# Patient Record
Sex: Male | Born: 1950 | Race: White | Hispanic: No | Marital: Married | Smoking: Never smoker
Health system: Southern US, Community
[De-identification: ages and names within clinical notes are randomized; demographics above are authoritative.]

## PROBLEM LIST (undated history)

## (undated) DIAGNOSIS — E785 Hyperlipidemia, unspecified: Secondary | ICD-10-CM

## (undated) DIAGNOSIS — I700 Atherosclerosis of aorta: Secondary | ICD-10-CM

## (undated) DIAGNOSIS — Z9889 Other specified postprocedural states: Secondary | ICD-10-CM

## (undated) DIAGNOSIS — Z955 Presence of coronary angioplasty implant and graft: Secondary | ICD-10-CM

## (undated) DIAGNOSIS — I2510 Atherosclerotic heart disease of native coronary artery without angina pectoris: Secondary | ICD-10-CM

## (undated) DIAGNOSIS — N200 Calculus of kidney: Secondary | ICD-10-CM

## (undated) DIAGNOSIS — K219 Gastro-esophageal reflux disease without esophagitis: Secondary | ICD-10-CM

## (undated) DIAGNOSIS — C801 Malignant (primary) neoplasm, unspecified: Secondary | ICD-10-CM

## (undated) DIAGNOSIS — I10 Essential (primary) hypertension: Secondary | ICD-10-CM

## (undated) DIAGNOSIS — M1990 Unspecified osteoarthritis, unspecified site: Secondary | ICD-10-CM

## (undated) DIAGNOSIS — Z87442 Personal history of urinary calculi: Secondary | ICD-10-CM

## (undated) HISTORY — PX: CORONARY ANGIOPLASTY: SHX604

## (undated) HISTORY — PX: KIDNEY STONE SURGERY: SHX686

## (undated) HISTORY — PX: HERNIA REPAIR: SHX51

---

## 2002-12-14 ENCOUNTER — Other Ambulatory Visit: Payer: Self-pay

## 2002-12-15 ENCOUNTER — Other Ambulatory Visit: Payer: Self-pay

## 2002-12-16 ENCOUNTER — Other Ambulatory Visit: Payer: Self-pay

## 2003-11-25 ENCOUNTER — Emergency Department: Payer: Self-pay | Admitting: General Practice

## 2005-12-29 ENCOUNTER — Ambulatory Visit: Payer: Self-pay | Admitting: Unknown Physician Specialty

## 2007-08-07 ENCOUNTER — Ambulatory Visit: Payer: Self-pay | Admitting: Unknown Physician Specialty

## 2007-12-13 ENCOUNTER — Ambulatory Visit: Payer: Self-pay | Admitting: Internal Medicine

## 2008-03-26 ENCOUNTER — Ambulatory Visit: Payer: Self-pay | Admitting: Urology

## 2008-06-30 ENCOUNTER — Ambulatory Visit: Payer: Self-pay | Admitting: Urology

## 2011-06-14 ENCOUNTER — Ambulatory Visit: Payer: Self-pay | Admitting: Internal Medicine

## 2011-07-05 ENCOUNTER — Ambulatory Visit: Payer: Self-pay | Admitting: Unknown Physician Specialty

## 2013-09-24 DIAGNOSIS — C4490 Unspecified malignant neoplasm of skin, unspecified: Secondary | ICD-10-CM | POA: Insufficient documentation

## 2013-09-24 DIAGNOSIS — K219 Gastro-esophageal reflux disease without esophagitis: Secondary | ICD-10-CM | POA: Insufficient documentation

## 2013-09-24 DIAGNOSIS — E785 Hyperlipidemia, unspecified: Secondary | ICD-10-CM | POA: Insufficient documentation

## 2013-09-24 DIAGNOSIS — I2510 Atherosclerotic heart disease of native coronary artery without angina pectoris: Secondary | ICD-10-CM | POA: Insufficient documentation

## 2013-09-24 DIAGNOSIS — I10 Essential (primary) hypertension: Secondary | ICD-10-CM | POA: Insufficient documentation

## 2013-09-24 DIAGNOSIS — D72819 Decreased white blood cell count, unspecified: Secondary | ICD-10-CM | POA: Insufficient documentation

## 2014-07-09 DIAGNOSIS — E559 Vitamin D deficiency, unspecified: Secondary | ICD-10-CM | POA: Insufficient documentation

## 2014-08-01 DIAGNOSIS — I10 Essential (primary) hypertension: Secondary | ICD-10-CM | POA: Insufficient documentation

## 2016-01-10 ENCOUNTER — Encounter: Payer: Self-pay | Admitting: Emergency Medicine

## 2016-01-10 ENCOUNTER — Emergency Department
Admission: EM | Admit: 2016-01-10 | Discharge: 2016-01-10 | Disposition: A | Discharge status: Home / Self Care | Payer: Medicare HMO | Attending: Emergency Medicine | Admitting: Emergency Medicine

## 2016-01-10 ENCOUNTER — Emergency Department: Payer: Medicare HMO

## 2016-01-10 DIAGNOSIS — N1330 Unspecified hydronephrosis: Secondary | ICD-10-CM | POA: Insufficient documentation

## 2016-01-10 DIAGNOSIS — N23 Unspecified renal colic: Secondary | ICD-10-CM | POA: Insufficient documentation

## 2016-01-10 DIAGNOSIS — M549 Dorsalgia, unspecified: Secondary | ICD-10-CM

## 2016-01-10 DIAGNOSIS — K4091 Unilateral inguinal hernia, without obstruction or gangrene, recurrent: Secondary | ICD-10-CM | POA: Diagnosis not present

## 2016-01-10 DIAGNOSIS — I10 Essential (primary) hypertension: Secondary | ICD-10-CM | POA: Diagnosis not present

## 2016-01-10 DIAGNOSIS — M545 Low back pain: Secondary | ICD-10-CM | POA: Diagnosis present

## 2016-01-10 HISTORY — DX: Hyperlipidemia, unspecified: E78.5

## 2016-01-10 HISTORY — DX: Calculus of kidney: N20.0

## 2016-01-10 HISTORY — DX: Essential (primary) hypertension: I10

## 2016-01-10 LAB — URINALYSIS, COMPLETE (UACMP) WITH MICROSCOPIC
Bilirubin Urine: NEGATIVE
GLUCOSE, UA: NEGATIVE mg/dL
Ketones, ur: NEGATIVE mg/dL
Leukocytes, UA: NEGATIVE
Nitrite: NEGATIVE
PH: 5 (ref 5.0–8.0)
Protein, ur: NEGATIVE mg/dL
RBC / HPF: 70 RBC/hpf — ABNORMAL HIGH (ref 0–5)
SPECIFIC GRAVITY, URINE: 1.013 (ref 1.005–1.030)
SQUAMOUS EPITHELIAL / LPF: NONE SEEN

## 2016-01-10 LAB — CBC WITH DIFFERENTIAL/PLATELET
BASOS ABS: 0.1 10*3/uL (ref 0–0.1)
BASOS PCT: 1 %
Eosinophils Absolute: 0.1 10*3/uL (ref 0–0.7)
Eosinophils Relative: 1 %
HEMATOCRIT: 52.9 % — AB (ref 40.0–52.0)
HEMOGLOBIN: 18.3 g/dL — AB (ref 13.0–18.0)
Lymphocytes Relative: 9 %
Lymphs Abs: 1 10*3/uL (ref 1.0–3.6)
MCH: 31.4 pg (ref 26.0–34.0)
MCHC: 34.6 g/dL (ref 32.0–36.0)
MCV: 90.7 fL (ref 80.0–100.0)
Monocytes Absolute: 0.8 10*3/uL (ref 0.2–1.0)
Monocytes Relative: 8 %
NEUTROS ABS: 8.7 10*3/uL — AB (ref 1.4–6.5)
NEUTROS PCT: 81 %
Platelets: 211 10*3/uL (ref 150–440)
RBC: 5.83 MIL/uL (ref 4.40–5.90)
RDW: 13.9 % (ref 11.5–14.5)
WBC: 10.7 10*3/uL — ABNORMAL HIGH (ref 3.8–10.6)

## 2016-01-10 LAB — COMPREHENSIVE METABOLIC PANEL
ALT: 23 U/L (ref 17–63)
ANION GAP: 5 (ref 5–15)
AST: 25 U/L (ref 15–41)
Albumin: 4.4 g/dL (ref 3.5–5.0)
Alkaline Phosphatase: 68 U/L (ref 38–126)
BILIRUBIN TOTAL: 0.7 mg/dL (ref 0.3–1.2)
BUN: 18 mg/dL (ref 6–20)
CALCIUM: 9.4 mg/dL (ref 8.9–10.3)
CO2: 27 mmol/L (ref 22–32)
Chloride: 105 mmol/L (ref 101–111)
Creatinine, Ser: 0.9 mg/dL (ref 0.61–1.24)
GFR calc non Af Amer: >60 mL/min (ref >60)
Glucose, Bld: 106 mg/dL — ABNORMAL HIGH (ref 65–99)
Potassium: 4 mmol/L (ref 3.5–5.1)
SODIUM: 137 mmol/L (ref 135–145)
TOTAL PROTEIN: 7.1 g/dL (ref 6.5–8.1)

## 2016-01-10 LAB — LIPASE, BLOOD: Lipase: 18 U/L (ref 11–51)

## 2016-01-10 MED ORDER — TAMSULOSIN HCL 0.4 MG PO CAPS: 0.4000 mg | ORAL_CAPSULE | Freq: Every day | ORAL | 0 refills | Status: DC

## 2016-01-10 MED ORDER — ONDANSETRON HCL 4 MG PO TABS: 4.0000 mg | ORAL_TABLET | Freq: Three times a day (TID) | ORAL | 0 refills | Status: DC | PRN

## 2016-01-10 NOTE — ED Notes (Signed)

## 2016-01-10 NOTE — Discharge Instructions (Signed)
You have an 8 mm stone. It will not likely pass on its own. You  need to follow closely with urology tomorrow or the next day. If you have fever, or increased pain return to the emergency room. We did discuss already all the findings on her CT scan including the questionable thickening of the bowel wall which will require outpatient follow-up with GI and you  primary care doctor, as well as the likely scarring in your lung base which she already knew about. If you have any new or worrisome symptoms please return to the emergency department. There was a pleasure to treat you today.  Happy new years.

## 2016-01-10 NOTE — ED Provider Notes (Addendum)
[facility] Emergency Department Provider Note  ____________________________________________   I have reviewed the triage vital signs and the nursing notes.   HISTORY  Chief Complaint Back Pain    HPI George [name]. is a 65 y.o. male who presents today complaining of right-sided back pain. History of kidney stones also history of disc issues. Patient states that he has had no fever or chills. The pain has been there for a few days. No vomiting but sometimes has had nausea. Mostly he has had kidney stones in the left and this feels like that except for on the right. No fever no chills no significant abdominal pain or numbness no weakness no cauda equina syndrome symptoms.  No dysuria no urinary frequency no other complaints   Past Medical History:  Diagnosis Date  . Hyperlipemia   . Hypertension   . Kidney stones     There are no active problems to display for this patient.   Past Surgical History:  Procedure Laterality Date  . HERNIA REPAIR      Prior to Admission medications   Not on File    Allergies Contrast media [iodinated diagnostic agents] and Penicillins  History reviewed. No pertinent family history.  Social History Social History  Substance Use Topics  . Smoking status: Never Smoker  . Smokeless tobacco: Never Used  . Alcohol use Yes    Review of Systems Constitutional: No fever/chills Eyes: No visual changes. ENT: No sore throat. No stiff neck no neck pain Cardiovascular: Denies chest pain. Respiratory: Denies shortness of breath. Gastrointestinal:   no vomiting.  No diarrhea.  No constipation. Genitourinary: Negative for dysuria. Musculoskeletal: Negative lower extremity swelling Skin: Negative for rash. Neurological: Negative for severe headaches, focal weakness or numbness. 10-point ROS otherwise negative.  ____________________________________________   PHYSICAL EXAM:  VITAL SIGNS: ED Triage Vitals  Enc  Vitals Group     BP 01/10/16 1541 (!) 158/90     Pulse Rate 01/10/16 1541 78     Resp 01/10/16 1541 18     Temp 01/10/16 1541 98 F (36.7 C)     Temp Source 01/10/16 1541 Oral     SpO2 01/10/16 1541 97 %     Weight 01/10/16 1542 187 lb (84.8 kg)     Height 01/10/16 1542 5' 10.5" (1.791 m)     Head Circumference --      Peak Flow --      Pain Score 01/10/16 1542 6     Pain Loc --      Pain Edu? --      Excl. in [place]? --     Constitutional: Alert and oriented. Well appearing and in no acute distress. Eyes: Conjunctivae are normal. PERRL. EOMI. Head: Atraumatic. Nose: No congestion/rhinnorhea. Mouth/Throat: Mucous membranes are moist.  Oropharynx non-erythematous. Neck: No stridor.   Nontender with no meningismus Cardiovascular: Normal rate, regular rhythm. Grossly normal heart sounds.  Good peripheral circulation. Respiratory: Normal respiratory effort.  No retractions. Lungs CTAB. Abdominal: Soft and nontender. No distention. No guarding no rebound Back:  There is no focal tenderness or step off.  there is no midline tenderness there are no lesions noted. there is Mild right CVA tenderness Musculoskeletal: No lower extremity tenderness, no upper extremity tenderness. No joint effusions, no DVT signs strong distal pulses no edema Neurologic:  Normal speech and language. No gross focal neurologic deficits are appreciated.  Skin:  Skin is warm, dry and intact. No rash noted. Psychiatric: Mood and affect  are normal. Speech and behavior are normal.  ____________________________________________   LABS (all labs ordered are listed, but only abnormal results are displayed)  Labs Reviewed  URINALYSIS, COMPLETE (UACMP) WITH MICROSCOPIC - Abnormal; Notable for the following:       Result Value   Color, Urine YELLOW (*)    APPearance CLEAR (*)    Hgb urine dipstick MODERATE (*)    RBC / HPF 70 (*)    All other components within normal limits  COMPREHENSIVE METABOLIC PANEL - Abnormal;  Notable for the following:    Glucose, Bld 106 (*)    All other components within normal limits  CBC WITH DIFFERENTIAL/PLATELET - Abnormal; Notable for the following:    WBC 10.7 (*)    Hemoglobin 18.3 (*)    HCT 52.9 (*)    Neutro Abs 8.7 (*)    All other components within normal limits  LIPASE, BLOOD   ____________________________________________  EKG  I personally interpreted any EKGs ordered by me or triage  ____________________________________________  RADIOLOGY  I reviewed any imaging ordered by me or triage that were performed during my shift and, if possible, patient and/or family made aware of any abnormal findings. ____________________________________________   PROCEDURES  Procedure(s) performed: None  Procedures  Critical Care performed: None  ____________________________________________   INITIAL IMPRESSION / ASSESSMENT AND PLAN / ED COURSE  Pertinent labs & imaging results that were available during my care of the patient were reviewed by me and considered in my medical decision making (see chart for details).  Patient has a urologist. He has excellent outpatient follow-up. He took a Ambulance person prior to coming in and he is not having any pain at this time. He declines any pain medication from me. Kidney function is preserved. I made him aware of all the findings on CT including the somewhat equivocal bowel thickening. Patient will follow closely with his doctor and have another colonoscopy. His last coma scale was 4 years ago and normal according to patient. He is aware of the possibility this could represent an oncologic process. In addition, patient made aware of possible copies of kidney stone including most importantly infection. He understands he must come back for increased pain, or fever. She states he has a known scarring as lung base from prior pneumonia, he has no symptoms of pneumonia, no other indication for further workup of that CT finding I think is  indicated. He feels very comfortable with all of these findings and will closely follow up as an outpatient. He has 16 Percocet left from prior illness and does not require a Percocet for Prescription however he would like Zofran and Flomax which we will provide.  Clinical Course    ____________________________________________   FINAL CLINICAL IMPRESSION(S) / ED DIAGNOSES  Final diagnoses:  Back pain      This chart was dictated using voice recognition software.  Despite best efforts to proofread,  errors can occur which can change meaning.      Schuyler Amor, MD 01/10/16 [place], MD 01/10/16 [phone]

## 2016-01-10 NOTE — ED Triage Notes (Signed)
Pt to ED with c/o of right side lower back pain that has been ongoing for "months". Pt states that he had a BM today and since the pain has increased. Pt has hx of kidney stones. Pt still has appendix and gall bladder.

## 2016-01-19 DIAGNOSIS — R935 Abnormal findings on diagnostic imaging of other abdominal regions, including retroperitoneum: Secondary | ICD-10-CM | POA: Insufficient documentation

## 2016-01-19 DIAGNOSIS — I700 Atherosclerosis of aorta: Secondary | ICD-10-CM | POA: Insufficient documentation

## 2016-01-19 DIAGNOSIS — E538 Deficiency of other specified B group vitamins: Secondary | ICD-10-CM | POA: Insufficient documentation

## 2016-01-29 ENCOUNTER — Encounter: Payer: Self-pay | Admitting: *Deleted

## 2016-01-29 ENCOUNTER — Ambulatory Visit
Admission: RE | Admit: 2016-01-29 | Discharge: 2016-01-29 | Disposition: A | Discharge status: Home / Self Care | Payer: Medicare HMO | Source: Ambulatory Visit | Attending: Unknown Physician Specialty | Admitting: Unknown Physician Specialty

## 2016-01-29 ENCOUNTER — Ambulatory Visit: Payer: Medicare HMO | Admitting: Anesthesiology

## 2016-01-29 ENCOUNTER — Encounter
Admission: RE | Disposition: A | Discharge status: Home / Self Care | Payer: Self-pay | Source: Ambulatory Visit | Attending: Unknown Physician Specialty

## 2016-01-29 DIAGNOSIS — I10 Essential (primary) hypertension: Secondary | ICD-10-CM | POA: Insufficient documentation

## 2016-01-29 DIAGNOSIS — K5730 Diverticulosis of large intestine without perforation or abscess without bleeding: Secondary | ICD-10-CM | POA: Insufficient documentation

## 2016-01-29 DIAGNOSIS — Z7982 Long term (current) use of aspirin: Secondary | ICD-10-CM | POA: Insufficient documentation

## 2016-01-29 DIAGNOSIS — Z86010 Personal history of colonic polyps: Secondary | ICD-10-CM | POA: Insufficient documentation

## 2016-01-29 DIAGNOSIS — Z955 Presence of coronary angioplasty implant and graft: Secondary | ICD-10-CM | POA: Insufficient documentation

## 2016-01-29 DIAGNOSIS — Z880 Allergy status to penicillin: Secondary | ICD-10-CM | POA: Insufficient documentation

## 2016-01-29 DIAGNOSIS — K640 First degree hemorrhoids: Secondary | ICD-10-CM | POA: Diagnosis not present

## 2016-01-29 DIAGNOSIS — E785 Hyperlipidemia, unspecified: Secondary | ICD-10-CM | POA: Insufficient documentation

## 2016-01-29 DIAGNOSIS — I2510 Atherosclerotic heart disease of native coronary artery without angina pectoris: Secondary | ICD-10-CM | POA: Diagnosis not present

## 2016-01-29 DIAGNOSIS — Z91041 Radiographic dye allergy status: Secondary | ICD-10-CM | POA: Insufficient documentation

## 2016-01-29 DIAGNOSIS — K621 Rectal polyp: Secondary | ICD-10-CM | POA: Diagnosis not present

## 2016-01-29 DIAGNOSIS — R933 Abnormal findings on diagnostic imaging of other parts of digestive tract: Secondary | ICD-10-CM | POA: Diagnosis present

## 2016-01-29 HISTORY — PX: COLONOSCOPY WITH PROPOFOL: SHX5780

## 2016-01-29 SURGERY — COLONOSCOPY WITH PROPOFOL
Anesthesia: General

## 2016-01-29 MED ORDER — MIDAZOLAM HCL 2 MG/2ML IJ SOLN
INTRAMUSCULAR | Status: AC
  Filled 2016-01-29: qty 2

## 2016-01-29 MED ORDER — SODIUM CHLORIDE 0.9 % IV SOLN
INTRAVENOUS | Status: DC
  Administered 2016-01-29: 08:00:00 via INTRAVENOUS

## 2016-01-29 MED ORDER — PROPOFOL 10 MG/ML IV BOLUS
INTRAVENOUS | Status: DC | PRN
Start: 2016-01-29 — End: 2016-01-29
  Administered 2016-01-29: 100 mg via INTRAVENOUS

## 2016-01-29 MED ORDER — PROPOFOL 500 MG/50ML IV EMUL
INTRAVENOUS | Status: DC | PRN
  Administered 2016-01-29: 120 ug/kg/min via INTRAVENOUS

## 2016-01-29 MED ORDER — MIDAZOLAM HCL 2 MG/2ML IJ SOLN
INTRAMUSCULAR | Status: DC | PRN
  Administered 2016-01-29: 1 mg via INTRAVENOUS

## 2016-01-29 MED ORDER — EPHEDRINE SULFATE-NACL 50-0.9 MG/10ML-% IV SOSY
PREFILLED_SYRINGE | INTRAVENOUS | Status: DC | PRN
Start: 2016-01-29 — End: 2016-01-29
  Administered 2016-01-29: 10 mg via INTRAVENOUS

## 2016-01-29 MED ORDER — PROPOFOL 10 MG/ML IV BOLUS
INTRAVENOUS | Status: AC
  Filled 2016-01-29: qty 20

## 2016-01-29 NOTE — Anesthesia Post-op Follow-up Note (Cosign Needed)
Anesthesia QCDR form completed.        

## 2016-01-29 NOTE — Anesthesia Preprocedure Evaluation (Signed)
Anesthesia Evaluation  Patient identified by MRN, date of birth, ID band Patient awake    Reviewed: Allergy & Precautions, H&P , NPO status , Patient's Chart, lab work & pertinent test results  History of Anesthesia Complications Negative for: history of anesthetic complications  Airway Mallampati: III  TM Distance: >3 FB Neck ROM: limited    Dental  (+) Poor Dentition, Chipped   Pulmonary neg pulmonary ROS, neg shortness of breath,    Pulmonary exam normal breath sounds clear to auscultation       Cardiovascular Exercise Tolerance: Good hypertension, (-) angina+ CAD and + Cardiac Stents  (-) Past MI and (-) DOE Normal cardiovascular exam Rhythm:regular Rate:Normal     Neuro/Psych negative neurological ROS  negative psych ROS   GI/Hepatic negative GI ROS, Neg liver ROS,   Endo/Other  negative endocrine ROS  Renal/GU Renal disease  negative genitourinary   Musculoskeletal   Abdominal   Peds  Hematology negative hematology ROS (+)   Anesthesia Other Findings Past Medical History: No date: Hyperlipemia No date: Hypertension No date: Kidney stones  Past Surgical History: No date: CORONARY ANGIOPLASTY No date: HERNIA REPAIR No date: KIDNEY STONE SURGERY  BMI    Body Mass Index:  26.88 kg/m      Reproductive/Obstetrics negative OB ROS                             Anesthesia Physical Anesthesia Plan  ASA: III  Anesthesia Plan: General   Post-op Pain Management:    Induction:   Airway Management Planned:   Additional Equipment:   Intra-op Plan:   Post-operative Plan:   Informed Consent: I have reviewed the patients History and Physical, chart, labs and discussed the procedure including the risks, benefits and alternatives for the proposed anesthesia with the patient or authorized representative who has indicated his/her understanding and acceptance.   Dental Advisory  Given  Plan Discussed with: Anesthesiologist, CRNA and Surgeon  Anesthesia Plan Comments:         Anesthesia Quick Evaluation

## 2016-01-29 NOTE — H&P (Signed)
   Primary Care Physician:  Adin Hector, MD Primary Gastroenterologist:  Dr. Vira Agar  Pre-Procedure History & Physical: HPI:  George [name]. is a 66 y.o. male is here for an colonoscopy.   Past Medical History:  Diagnosis Date  . Hyperlipemia   . Hypertension   . Kidney stones     Past Surgical History:  Procedure Laterality Date  . CORONARY ANGIOPLASTY    . HERNIA REPAIR    . KIDNEY STONE SURGERY      Prior to Admission medications   Medication Sig Start Date End Date Taking? Authorizing Provider  aspirin EC 81 MG tablet Take 81 mg by mouth daily.   Yes Historical Provider, MD  cetirizine (ZYRTEC) 10 MG tablet Take 10 mg by mouth daily as needed for allergies.   Yes Historical Provider, MD  Cyanocobalamin (VITAMIN B 12 PO) Take 1 tablet by mouth daily.   Yes Historical Provider, MD  quinapril (ACCUPRIL) 20 MG tablet Take 20 mg by mouth daily.   Yes Historical Provider, MD  rosuvastatin (CRESTOR) 20 MG tablet Take 20 mg by mouth daily.   Yes Historical Provider, MD  ondansetron (ZOFRAN) 4 MG tablet Take 1 tablet (4 mg total) by mouth every 8 (eight) hours as needed for nausea or vomiting. 01/10/16   Schuyler Amor, MD  tamsulosin (FLOMAX) 0.4 MG CAPS capsule Take 1 capsule (0.4 mg total) by mouth daily. Patient not taking: Reported on 01/29/2016 01/10/16   Schuyler Amor, MD    Allergies as of 01/28/2016 - Review Complete 01/10/2016  Allergen Reaction Noted  . Contrast media [iodinated diagnostic agents]  01/10/2016  . Penicillins  01/10/2016    History reviewed. No pertinent family history.  Social History   Social History  . Marital status: Married    Spouse name: N/A  . Number of children: N/A  . Years of education: N/A   Occupational History  . Not on file.   Social History Main Topics  . Smoking status: Never Smoker  . Smokeless tobacco: Never Used  . Alcohol use Yes  . Drug use: No  . Sexual activity: Not on file   Other Topics Concern  .  Not on file   Social History Narrative  . No narrative on file    Review of Systems: See HPI, otherwise negative ROS  Physical Exam: BP 119/83   Pulse 64   Temp 97.8 F (36.6 C) (Tympanic)   Resp 16   Ht 5' 10.5" (1.791 m)   Wt 86.2 kg (190 lb)   SpO2 99%   BMI 26.88 kg/m  General:   Alert,  pleasant and cooperative in NAD Head:  Normocephalic and atraumatic. Neck:  Supple; no masses or thyromegaly. Lungs:  Clear throughout to auscultation.    Heart:  Regular rate and rhythm. Abdomen:  Soft, nontender and nondistended. Normal bowel sounds, without guarding, and without rebound.   Neurologic:  Alert and  oriented x4;  grossly normal neurologically.  Impression/Plan: George [name]. is here for an colonoscopy to be performed for abnormal CT showing thick wall colon in transverse colon.  Pt also does have a hx of colon polyps.  Risks, benefits, limitations, and alternatives regarding  colonoscopy have been reviewed with the patient.  Questions have been answered.  All parties agreeable.   Gaylyn Cheers, MD  01/29/2016, 8:41 AM

## 2016-01-29 NOTE — Op Note (Signed)
[facility] Patient Name: George [name] Procedure Date: 01/29/2016 8:33 AM MRN: [id number] Account #: [id number] Date of Birth: 19-Feb-1950 Admit Type: Outpatient Age: 66 Room: [facility] ENDO ROOM 1 Gender: Male Note Status: Finalized Procedure:            Colonoscopy Indications:          Abdominal pain, Abnormal CT of the GI tract Providers:            Manya Silvas, MD Referring MD:         Ramonita Lab, MD (Referring MD) Medicines:            Propofol per Anesthesia Complications:        No immediate complications. Procedure:            Pre-Anesthesia Assessment:                       - After reviewing the risks and benefits, the patient                        was deemed in satisfactory condition to undergo the                        procedure.                       After obtaining informed consent, the colonoscope was                        passed under direct vision. Throughout the procedure,                        the patient's blood pressure, pulse, and oxygen                        saturations were monitored continuously. The                        Colonoscope was introduced through the anus and                        advanced to the the cecum, identified by appendiceal                        orifice and ileocecal valve. The colonoscopy was                        performed without difficulty. The patient tolerated the                        procedure well. The quality of the bowel preparation                        was excellent. Findings:      Four sessile polyps were found in the rectum and recto-sigmoid colon.       The polyps were diminutive in size. These polyps were removed with a       jumbo cold forceps. Resection and retrieval were complete.      Multiple small-mouthed diverticula were found in the sigmoid colon and       descending colon.      Internal hemorrhoids were  found during endoscopy. The hemorrhoids were       small and  Grade I (internal hemorrhoids that do not prolapse).      The exam was otherwise without abnormality. Impression:           - Four diminutive polyps in the rectum and at the                        recto-sigmoid colon, removed with a jumbo cold forceps.                        Resected and retrieved.                       - Diverticulosis in the sigmoid colon and in the                        descending colon.                       - Internal hemorrhoids.                       - The examination was otherwise normal. Recommendation:       - Await pathology results. Manya Silvas, MD 01/29/2016 9:16:10 AM This report has been signed electronically. Number of Addenda: 0 Note Initiated On: 01/29/2016 8:33 AM Scope Withdrawal Time: 0 hours 15 minutes 24 seconds  Total Procedure Duration: 0 hours 22 minutes 38 seconds       [facility]

## 2016-01-29 NOTE — Transfer of Care (Signed)
Immediate Anesthesia Transfer of Care Note  Patient: George [name].  Procedure(s) Performed: Procedure(s): COLONOSCOPY WITH PROPOFOL (N/A)  Patient Location: PACU and Endoscopy Unit  Anesthesia Type:General  Level of Consciousness: patient cooperative and lethargic  Airway & Oxygen Therapy: Patient Spontanous Breathing and Patient connected to nasal cannula oxygen  Post-op Assessment: Report given to RN and Post -op Vital signs reviewed and stable  Post vital signs: Reviewed and stable  Last Vitals:  Vitals:   01/29/16 0919 01/29/16 0923  BP: (!) 91/49 (!) 91/49  Pulse: 64 64  Resp: 15   Temp: 36.1 C     Last Pain:  Vitals:   01/29/16 0919  TempSrc: Tympanic         Complications: No apparent anesthesia complications

## 2016-01-29 NOTE — Anesthesia Postprocedure Evaluation (Signed)
Anesthesia Post Note  Patient: George [name].  Procedure(s) Performed: Procedure(s) (LRB): COLONOSCOPY WITH PROPOFOL (N/A)  Patient location during evaluation: Endoscopy Anesthesia Type: General Level of consciousness: awake and alert Pain management: pain level controlled Vital Signs Assessment: post-procedure vital signs reviewed and stable Respiratory status: spontaneous breathing, nonlabored ventilation, respiratory function stable and patient connected to nasal cannula oxygen Cardiovascular status: blood pressure returned to baseline and stable Postop Assessment: no signs of nausea or vomiting Anesthetic complications: no     Last Vitals:  Vitals:   01/29/16 0939 01/29/16 0949  BP: 103/75 109/71  Pulse: 64 65  Resp: 17 20  Temp:      Last Pain:  Vitals:   01/29/16 0919  TempSrc: Tympanic                 Precious Haws Lanis Storlie

## 2016-02-01 ENCOUNTER — Encounter: Payer: Self-pay | Admitting: Unknown Physician Specialty

## 2016-02-01 LAB — SURGICAL PATHOLOGY

## 2017-01-30 ENCOUNTER — Telehealth: Payer: Self-pay | Admitting: Radiology

## 2017-01-30 DIAGNOSIS — N200 Calculus of kidney: Secondary | ICD-10-CM

## 2017-01-30 NOTE — Telephone Encounter (Signed)
Pt called to make f/u appt for kidney stones. States he was supposed to get imaging prior to appt. Please advise.

## 2017-01-31 NOTE — Telephone Encounter (Signed)
Needs a KUB- order was entered

## 2017-01-31 NOTE — Telephone Encounter (Signed)
Pt notified of KUB prior to f/u appt with Dr Bernardo Heater. Pt voices understanding & no other questions at this time.

## 2017-02-15 ENCOUNTER — Ambulatory Visit
Admission: RE | Admit: 2017-02-15 | Discharge: 2017-02-15 | Disposition: A | Discharge status: Home / Self Care | Payer: Medicare HMO | Source: Ambulatory Visit | Attending: Urology | Admitting: Urology

## 2017-02-15 ENCOUNTER — Ambulatory Visit: Payer: Medicare HMO | Admitting: Urology

## 2017-02-15 ENCOUNTER — Encounter: Payer: Self-pay | Admitting: Urology

## 2017-02-15 VITALS — BP 122/74 | HR 76 | Ht 70.0 in | Wt 191.0 lb

## 2017-02-15 DIAGNOSIS — N200 Calculus of kidney: Secondary | ICD-10-CM

## 2017-02-15 NOTE — Progress Notes (Signed)
error 

## 2017-02-16 ENCOUNTER — Encounter: Payer: Self-pay | Admitting: Urology

## 2017-02-16 DIAGNOSIS — N200 Calculus of kidney: Secondary | ICD-10-CM | POA: Insufficient documentation

## 2017-02-16 NOTE — Progress Notes (Signed)
02/15/2017 6:59 AM   George [name]. [birth year] [id number]  Referring provider: Adin Hector, MD [facility] [place], [place] [address]  Chief Complaint  Patient presents with  . Nephrolithiasis    HPI: 67 year old male presents for follow-up of nephrolithiasis.  He has a long history of recurrent stone disease.  I last saw him at [facility] in April 2018.  He underwent ureteroscopic removal of an 8 mm right proximal ureteral calculus in January 2018.  He had multiple right renal calculi which were also removed.  His CT scan did show nonobstructing left renal calculi.  Stone analysis was 100% mixed calcium oxalate.  He was having some dull left back pain and states this resolved after getting a new mattress.  He denies dysuria or gross hematuria.  A PSA in January 2019 was 1.65.   PMH: Past Medical History:  Diagnosis Date  . Hyperlipemia   . Hypertension   . Kidney stones     Surgical History: Past Surgical History:  Procedure Laterality Date  . COLONOSCOPY WITH PROPOFOL N/A 01/29/2016   Procedure: COLONOSCOPY WITH PROPOFOL;  Surgeon: Manya Silvas, MD;  Location[facility] ENDOSCOPY;  Service: Endoscopy;  Laterality: N/A;  . CORONARY ANGIOPLASTY    . HERNIA REPAIR    . KIDNEY STONE SURGERY      Home Medications:  Allergies as of 02/15/2017      Reactions   Contrast Media [iodinated Diagnostic Agents]    Penicillins       Medication List        Accurate as of 02/15/17 11:59 PM. Always use your most recent med list.          aspirin EC 81 MG tablet Take 81 mg by mouth daily.   cetirizine 10 MG tablet Commonly known as:  ZYRTEC Take 10 mg by mouth daily as needed for allergies.   ondansetron 4 MG tablet Commonly known as:  ZOFRAN Take 1 tablet (4 mg total) by mouth every 8 (eight) hours as needed for nausea or vomiting.   quinapril 20 MG tablet Commonly known as:  ACCUPRIL Take 20 mg by mouth daily.   rosuvastatin 20 MG  tablet Commonly known as:  CRESTOR Take 20 mg by mouth daily.   tamsulosin 0.4 MG Caps capsule Commonly known as:  FLOMAX Take 1 capsule (0.4 mg total) by mouth daily.   VITAMIN B 12 PO Take 1 tablet by mouth daily.       Allergies:  Allergies  Allergen Reactions  . Contrast Media [Iodinated Diagnostic Agents]   . Penicillins     Family History: History reviewed. No pertinent family history.  Social History:  reports that  has never smoked. he has never used smokeless tobacco. He reports that he drinks alcohol. He reports that he does not use drugs.  ROS: UROLOGY Frequent Urination?: No Hard to postpone urination?: No Burning/pain with urination?: No Get up at night to urinate?: No Leakage of urine?: No Urine stream starts and stops?: No Trouble starting stream?: No Do you have to strain to urinate?: No Blood in urine?: No Urinary tract infection?: No Sexually transmitted disease?: No Injury to kidneys or bladder?: No Painful intercourse?: No Weak stream?: No Erection problems?: No Penile pain?: No  Gastrointestinal Nausea?: No Vomiting?: No Indigestion/heartburn?: No Diarrhea?: No Constipation?: No  Constitutional Fever: No Night sweats?: No Weight loss?: No Fatigue?: No  Skin Skin rash/lesions?: No Itching?: No  Eyes Blurred vision?: No Double  vision?: No  Ears/Nose/Throat Sore throat?: No Sinus problems?: Yes  Hematologic/Lymphatic Swollen glands?: No Easy bruising?: No  Cardiovascular Leg swelling?: No Chest pain?: No  Respiratory Cough?: No Shortness of breath?: No  Endocrine Excessive thirst?: No  Musculoskeletal Back pain?: Yes Joint pain?: No  Neurological Headaches?: No Dizziness?: No  Psychologic Depression?: No Anxiety?: No  Physical Exam: BP 122/74   Pulse 76   Ht 5\' 10"  (1.778 m)   Wt 191 lb (86.6 kg)   BMI 27.41 kg/m   Constitutional:  Alert and oriented, No acute distress. HEENT: [place] AT, moist mucus  membranes.  Trachea midline, no masses. Cardiovascular: No clubbing, cyanosis, or edema. Respiratory: Normal respiratory effort, no increased work of breathing. GI: Abdomen is soft, nontender, nondistended, no abdominal masses GU: No CVA tenderness.  Skin: No rashes, bruises or suspicious lesions. Lymph: No cervical or inguinal adenopathy. Neurologic: Grossly intact, no focal deficits, moving all 4 extremities. Psychiatric: Normal mood and affect.  Laboratory Data: Lab Results  Component Value Date   WBC 10.7 (H) 01/10/2016   HGB 18.3 (H) 01/10/2016   HCT 52.9 (H) 01/10/2016   MCV 90.7 01/10/2016   PLT 211 01/10/2016    Lab Results  Component Value Date   CREATININE 0.90 01/10/2016    Urinalysis Lab Results  Component Value Date   APPEARANCEUR CLEAR (A) 01/10/2016   LEUKOCYTESUR NEGATIVE 01/10/2016   PROTEINUR NEGATIVE 01/10/2016   GLUCOSEU NEGATIVE 01/10/2016   RBCU 70 (H) 01/10/2016   BILIRUBINUR NEGATIVE 01/10/2016   NITRITE NEGATIVE 01/10/2016    Pertinent Imaging: Images personally reviewed Results for orders placed during the hospital encounter of 02/15/17  Abdomen 1 view (KUB)   Narrative CLINICAL DATA:  Nephrolithiasis  EXAM: ABDOMEN - 1 VIEW  COMPARISON:  None.  FINDINGS: Small (2 mm) LEFT renal calculus noted. Small (4 mm) no bladder calculi RIGHT renal calculus identified. No clear ureterolithiasis.  IMPRESSION: Bilateral nephrolithiasis.  No ureterolithiasis is identified.   Electronically Signed   By: Suzy Bouchard M.D.   On: 02/15/2017 15:20     Results for orders placed during the hospital encounter of 01/10/16  CT RENAL STONE STUDY   Narrative CLINICAL DATA:  Right lower back pain for several months.  EXAM: CT ABDOMEN AND PELVIS WITHOUT CONTRAST  TECHNIQUE: Multidetector CT imaging of the abdomen and pelvis was performed following the standard protocol without IV contrast.  COMPARISON:  December 13, 2007  FINDINGS: Lower  chest: There is patchy opacity of the lateral left lung base, developing pneumonia is not excluded. The heart size is normal.  Hepatobiliary: There are several subcentimeter cyst in the liver. The gallbladder is normal. The biliary tree is normal.  Pancreas: Unremarkable. No pancreatic ductal dilatation or surrounding inflammatory changes.  Spleen: Normal in size without focal abnormality.  Adrenals/Urinary Tract: There is right hydronephrosis due to obstruction by a 8 mm stone in the proximal right ureter. There are nonobstructing stones in bilateral kidneys. There is no left hydronephrosis. The adrenal glands are normal. The bladder is normal.  Stomach/Bowel: There is a segment of thick walled colon in the mid transverse colon just to the left of midline. Another short segment of thick wall colon is identified in the right transverse colon. There is no small bowel obstruction. The appendix is normal.  Vascular/Lymphatic: Aortic atherosclerosis. No enlarged abdominal or pelvic lymph nodes.  Reproductive: Prostate is unremarkable.  Other: There is minimal left inguinal herniation of mesenteric fat.  Musculoskeletal: Degenerative joint changes of the spine  are identified.  IMPRESSION: Right hydroureteronephrosis due to obstruction by a 8 mm stone in the proximal right ureter.  Patchy opacity of lateral left lung base, developing pneumonia is not excluded.  Two segments of abnormal thick walled colon in the transverse colon. Colon neoplasm is not excluded. Further evaluation with direct visualization is recommended.   Electronically Signed   By: Abelardo Diesel M.D.   On: 01/10/2016 17:46     Assessment & Plan:   1. Nephrolithiasis KUB today shows bilateral, nonobstructing renal calculi.  He will follow-up in 1 year with a KUB and was instructed to call earlier for development of recurrent renal colic.   Return in about 1 year (around 02/15/2018) for Recheck,  KUB.  Abbie Sons, [place] [address], [place], [place] [address] [phone]

## 2017-08-29 ENCOUNTER — Other Ambulatory Visit: Payer: Self-pay | Admitting: Internal Medicine

## 2017-08-29 DIAGNOSIS — R911 Solitary pulmonary nodule: Secondary | ICD-10-CM

## 2017-09-01 ENCOUNTER — Ambulatory Visit
Admission: RE | Admit: 2017-09-01 | Discharge: 2017-09-01 | Disposition: A | Discharge status: Home / Self Care | Payer: Medicare HMO | Source: Ambulatory Visit | Attending: Internal Medicine | Admitting: Internal Medicine

## 2017-09-01 DIAGNOSIS — R911 Solitary pulmonary nodule: Secondary | ICD-10-CM | POA: Diagnosis present

## 2017-09-01 DIAGNOSIS — J479 Bronchiectasis, uncomplicated: Secondary | ICD-10-CM | POA: Diagnosis not present

## 2017-09-05 DIAGNOSIS — R9389 Abnormal findings on diagnostic imaging of other specified body structures: Secondary | ICD-10-CM | POA: Insufficient documentation

## 2018-01-16 IMAGING — CT CT RENAL STONE PROTOCOL
2 of 4 series · 16 of 46 positions shown, 18 images · non-contrast
Comparison: December 13, 2007

CLINICAL DATA: Right lower back pain for several months.

EXAM:
CT ABDOMEN AND PELVIS WITHOUT CONTRAST
TECHNIQUE: Multidetector CT imaging of the abdomen and pelvis was performed
following the standard protocol without IV contrast.

[Series 2: stone full standard · axial · 0.77mm/px · z∈[-824,-374]mm · 13 of 100 slices shown, 15 images]
[im 5/100  soft-tissue]
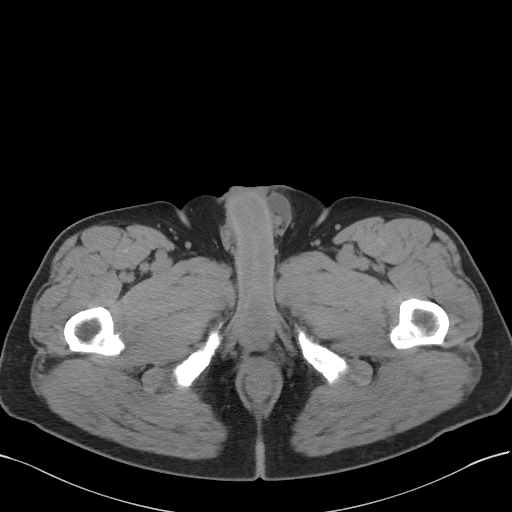
[im 5/100  bone]
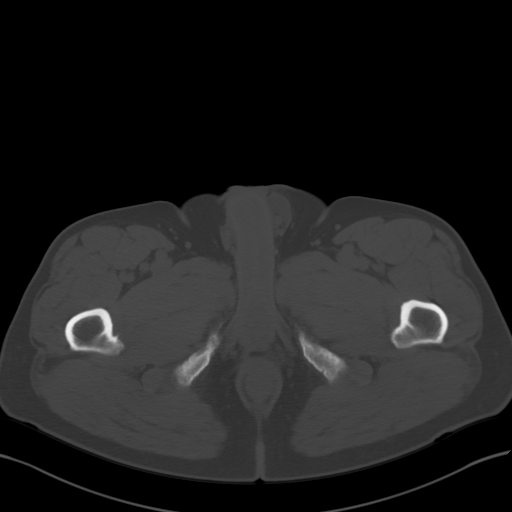
[im 13/100  soft-tissue]
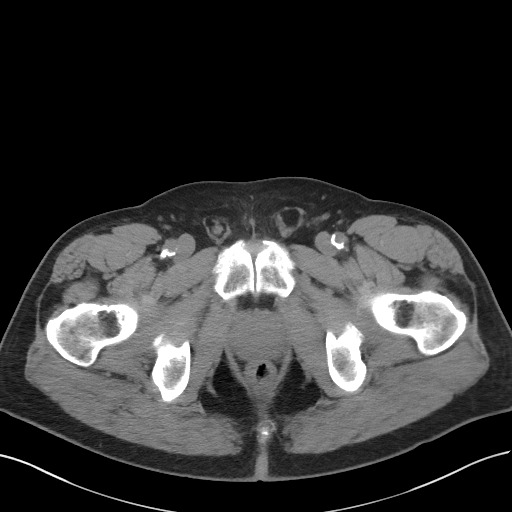
[im 21/100  soft-tissue]
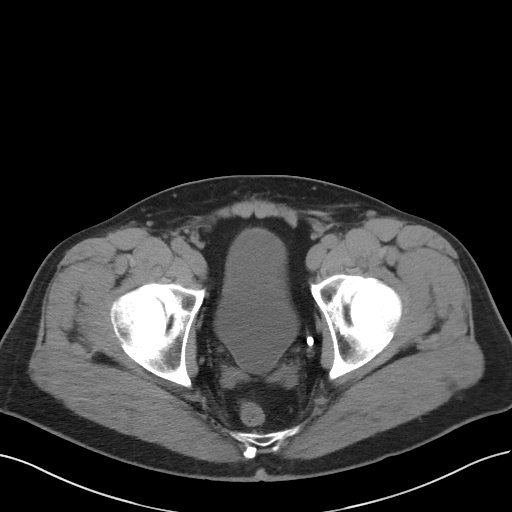
[im 29/100  soft-tissue]
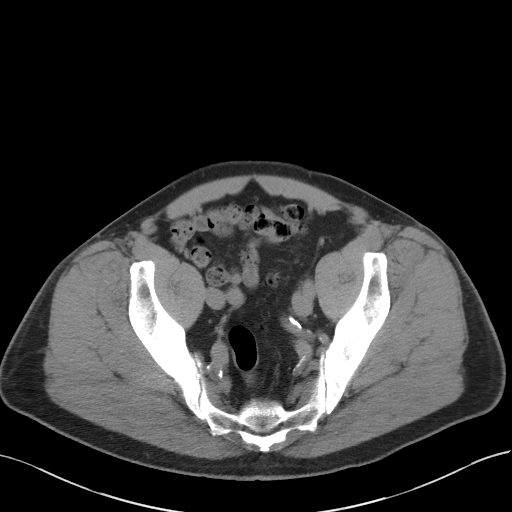
[im 34/100  soft-tissue]
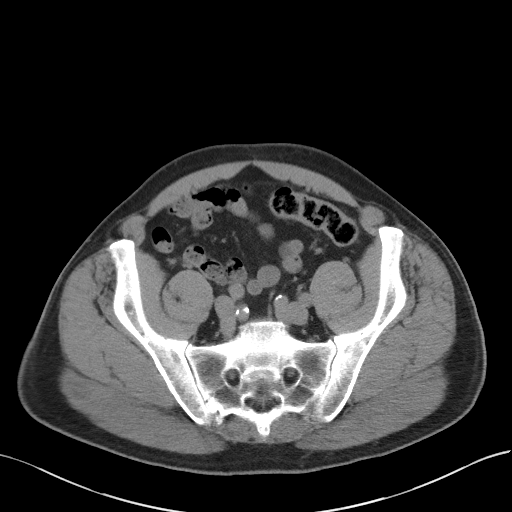
[im 42/100  soft-tissue]
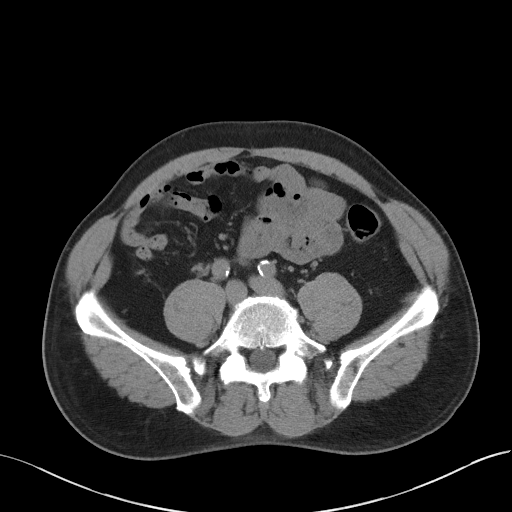
[im 50/100  soft-tissue]
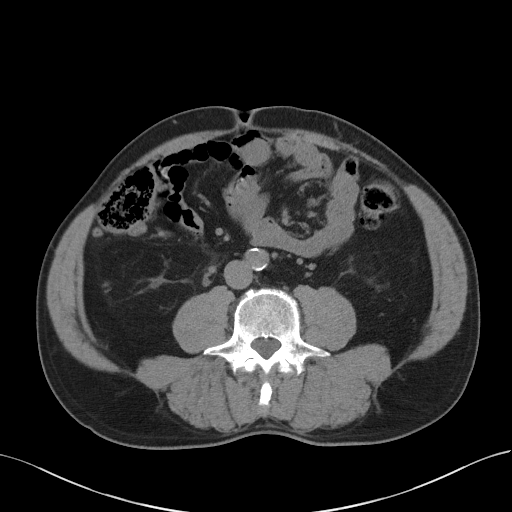
[im 58/100  soft-tissue]
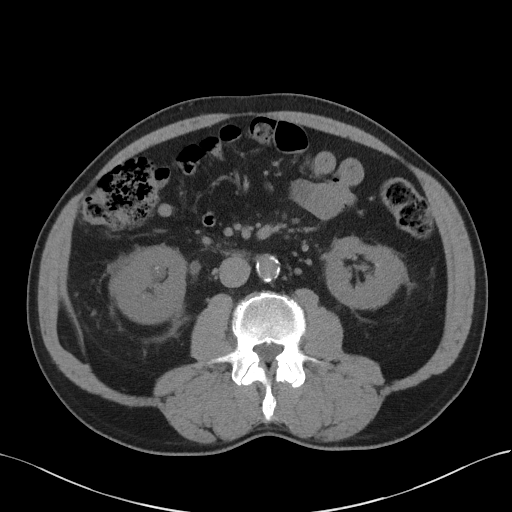
[im 67/100  soft-tissue]
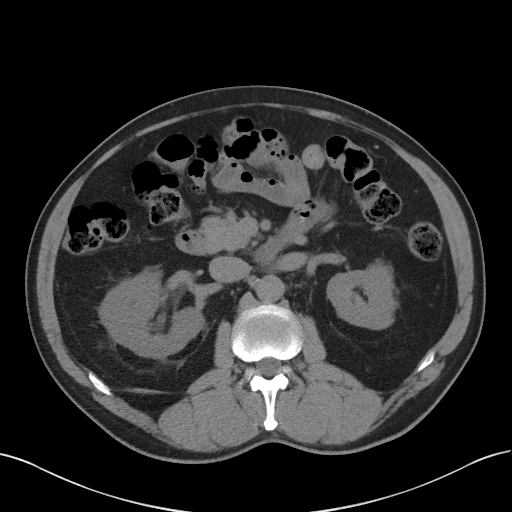
[im 67/100  bone]
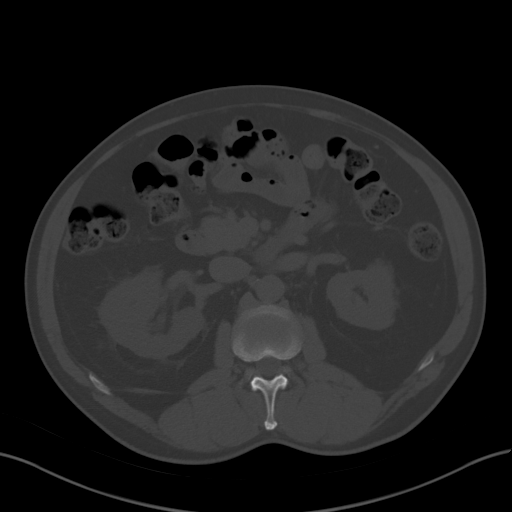
[im 71/100  soft-tissue]
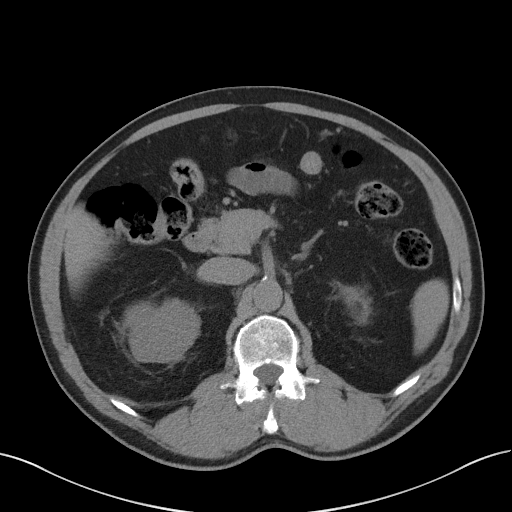
[im 79/100  soft-tissue]
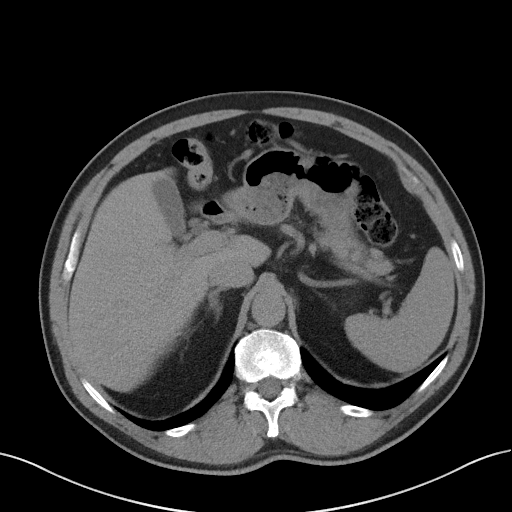
[im 87/100  soft-tissue]
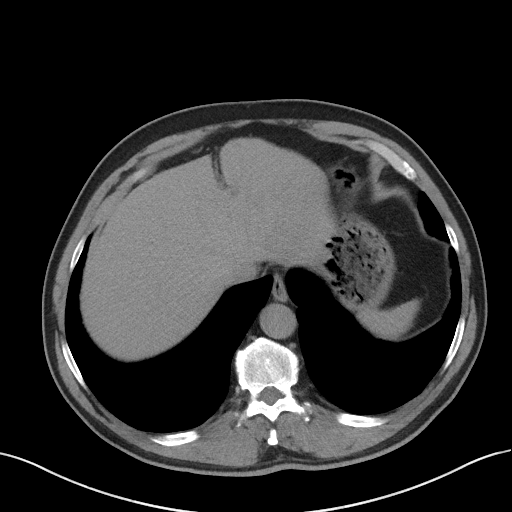
[im 95/100  soft-tissue]
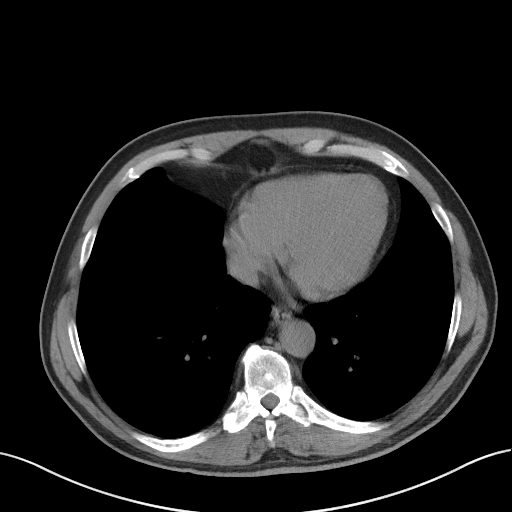

[Series 5: coronal · coronal · 0.73mm/px · 3 of 148 slices shown]
[im 50/148  soft-tissue]
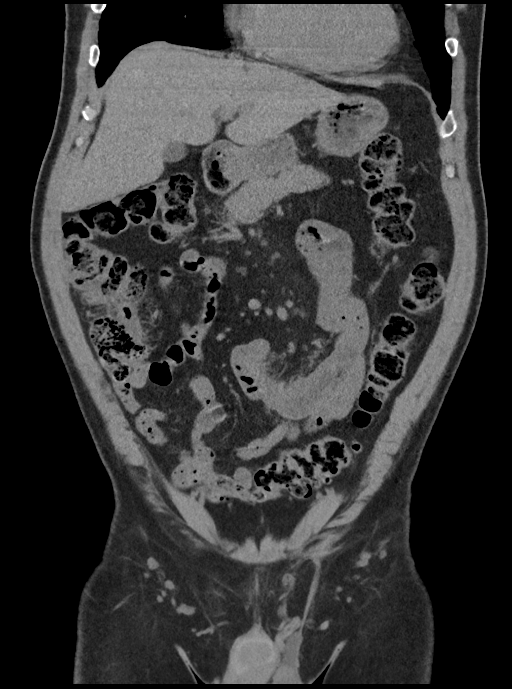
[im 66/148  soft-tissue]
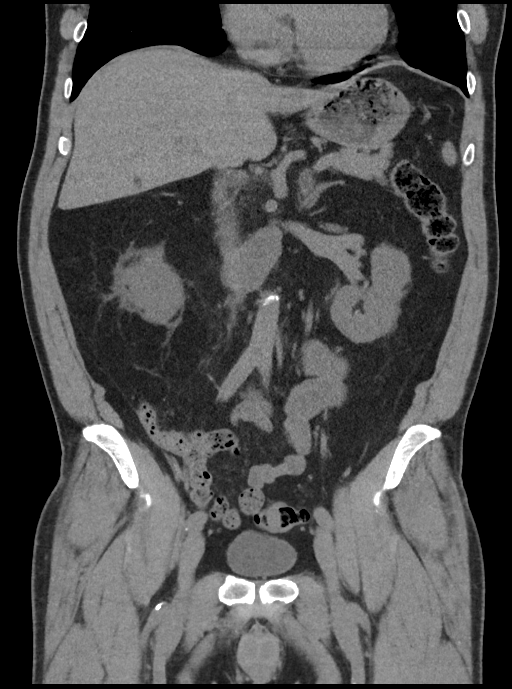
[im 82/148  soft-tissue]
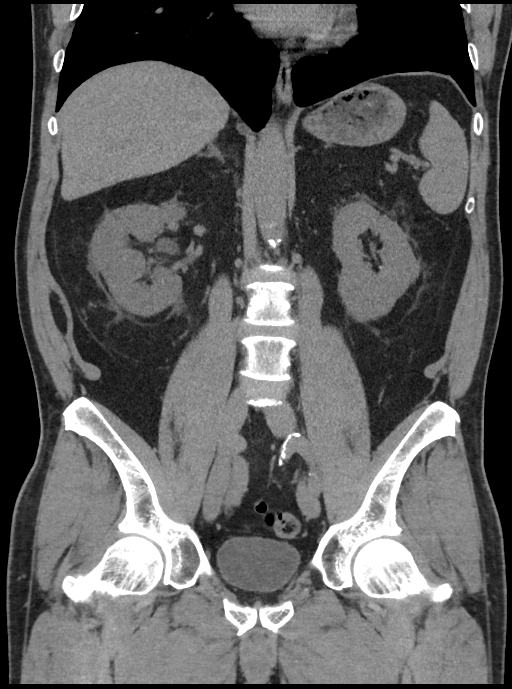

[16 of 46 positions shown; findings below may reference images not displayed]

FINDINGS: Lower chest: There is patchy opacity of the lateral left lung base,
developing pneumonia is not excluded. The heart size is normal.

Hepatobiliary: There are several subcentimeter cyst in the liver.
The gallbladder is normal. The biliary tree is normal.

Pancreas: Unremarkable. No pancreatic ductal dilatation or
surrounding inflammatory changes.

Spleen: Normal in size without focal abnormality.

Adrenals/Urinary Tract: There is right hydronephrosis due to
obstruction by a 8 mm stone in the proximal right ureter. There are
nonobstructing stones in bilateral kidneys. There is no left
hydronephrosis. The adrenal glands are normal. The bladder is
normal.

Stomach/Bowel: There is a segment of thick walled colon in the mid
transverse colon just to the left of midline. Another short segment
of thick wall colon is identified in the right transverse colon.
There is no small bowel obstruction. The appendix is normal.

Vascular/Lymphatic: Aortic atherosclerosis. No enlarged abdominal or
pelvic lymph nodes.

Reproductive: Prostate is unremarkable.

Other: There is minimal left inguinal herniation of mesenteric fat.

Musculoskeletal: Degenerative joint changes of the spine are
identified.
IMPRESSION: Right hydroureteronephrosis due to obstruction by a 8 mm stone in
the proximal right ureter.

Patchy opacity of lateral left lung base, developing pneumonia is
not excluded.

Two segments of abnormal thick walled colon in the transverse colon.
Colon neoplasm is not excluded. Further evaluation with direct
visualization is recommended.

## 2018-02-14 ENCOUNTER — Ambulatory Visit: Payer: Medicare HMO | Admitting: Urology

## 2018-02-14 ENCOUNTER — Encounter: Payer: Self-pay | Admitting: Urology

## 2018-02-14 VITALS — BP 136/86 | HR 64 | Ht 70.0 in | Wt 192.0 lb

## 2018-02-14 DIAGNOSIS — N200 Calculus of kidney: Secondary | ICD-10-CM

## 2018-02-14 DIAGNOSIS — Z85828 Personal history of other malignant neoplasm of skin: Secondary | ICD-10-CM | POA: Insufficient documentation

## 2018-02-14 NOTE — Progress Notes (Signed)
02/14/2018 8:26 AM   George [name]. Jul 13, 1950 [id number]  Referring provider: Adin Hector, MD [place] [facility] [place], [place] [address]  Chief Complaint  Patient presents with  . Nephrolithiasis   Urologic history: 1.  Recurrent stone disease  -Bilateral nonobstructing nephrolithiasis  HPI: 68 year old male presents for annual follow-up.  Since his visit last year he denies recurrent renal colic, flank or abdominal pain.  PSA performed at [facility] [place] earlier this week was stable at 1.72.  He has no bothersome lower urinary tract symptoms.   PMH: Past Medical History:  Diagnosis Date  . Hyperlipemia   . Hypertension   . Kidney stones     Surgical History: Past Surgical History:  Procedure Laterality Date  . COLONOSCOPY WITH PROPOFOL N/A 01/29/2016   Procedure: COLONOSCOPY WITH PROPOFOL;  Surgeon: Manya Silvas, MD;  Location: [facility] ENDOSCOPY;  Service: Endoscopy;  Laterality: N/A;  . CORONARY ANGIOPLASTY    . HERNIA REPAIR    . KIDNEY STONE SURGERY      Home Medications:  Allergies as of 02/14/2018      Reactions   Contrast Media [iodinated Diagnostic Agents]    Penicillins       Medication List       Accurate as of February 14, 2018  8:26 AM. Always use your most recent med list.        aspirin EC 81 MG tablet Take 81 mg by mouth daily.   cetirizine 10 MG tablet Commonly known as:  ZYRTEC Take 10 mg by mouth daily as needed for allergies.   omeprazole 40 MG capsule Commonly known as:  PRILOSEC   quinapril 20 MG tablet Commonly known as:  ACCUPRIL Take 20 mg by mouth daily.   rosuvastatin 40 MG tablet Commonly known as:  CRESTOR   tadalafil 20 MG tablet Commonly known as:  ADCIRCA/CIALIS   tamsulosin 0.4 MG Caps capsule Commonly known as:  FLOMAX Take 1 capsule (0.4 mg total) by mouth daily.   VITAMIN B 12 PO Take 1 tablet by mouth daily.       Allergies:  Allergies  Allergen Reactions  .  Contrast Media [Iodinated Diagnostic Agents]   . Penicillins     Family History: No family history on file.  Social History:  reports that he has never smoked. He has never used smokeless tobacco. He reports current alcohol use. He reports that he does not use drugs.  ROS: UROLOGY Frequent Urination?: No Hard to postpone urination?: No Burning/pain with urination?: No Get up at night to urinate?: Yes Leakage of urine?: No Urine stream starts and stops?: No Trouble starting stream?: No Do you have to strain to urinate?: No Blood in urine?: No Urinary tract infection?: No Sexually transmitted disease?: No Injury to kidneys or bladder?: No Painful intercourse?: No Weak stream?: No Erection problems?: No Penile pain?: No  Gastrointestinal Nausea?: No Vomiting?: No Indigestion/heartburn?: No Diarrhea?: No Constipation?: No  Constitutional Fever: No Night sweats?: No Weight loss?: No Fatigue?: No  Skin Skin rash/lesions?: No Itching?: No  Eyes Blurred vision?: No Double vision?: No  Ears/Nose/Throat Sore throat?: No Sinus problems?: No  Hematologic/Lymphatic Swollen glands?: No Easy bruising?: No  Cardiovascular Leg swelling?: No Chest pain?: No  Respiratory Cough?: No Shortness of breath?: No  Endocrine Excessive thirst?: No  Musculoskeletal Back pain?: No Joint pain?: No  Neurological Headaches?: No Dizziness?: No  Psychologic Depression?: No Anxiety?: No  Physical Exam: BP 136/86 (BP Location:  Left Arm, Patient Position: Sitting, Cuff Size: Normal)   Pulse 64   Ht 5\' 10"  (1.778 m)   Wt 192 lb (87.1 kg)   BMI 27.55 kg/m   Constitutional:  Alert and oriented, No acute distress. HEENT: [place] AT, moist mucus membranes.  Trachea midline, no masses. Cardiovascular: No clubbing, cyanosis, or edema. Respiratory: Normal respiratory effort, no increased work of breathing. Skin: No rashes, bruises or suspicious lesions. Neurologic: Grossly  intact, no focal deficits, moving all 4 extremities. Psychiatric: Normal mood and affect.   Assessment & Plan:   68 year old male with bilateral nephrolithiasis.  Presently asymptomatic.  A KUB was ordered and he will be notified with results.  If stable continue annual follow-up.   Abbie Sons, [place] [address] [place],  [address] [phone]

## 2018-02-14 NOTE — Patient Instructions (Signed)
Dietary Guidelines to Help Prevent Kidney Stones Kidney stones are deposits of minerals and salts that form inside your kidneys. Your risk of developing kidney stones may be greater depending on your diet, your lifestyle, the medicines you take, and whether you have certain medical conditions. Most people can reduce their chances of developing kidney stones by following the instructions below. Depending on your overall health and the type of kidney stones you tend to develop, your dietitian may give you more specific instructions. What are tips for following this plan? Reading food labels  Choose foods with "no salt added" or "low-salt" labels. Limit your sodium intake to less than 1500 mg per day.  Choose foods with calcium for each meal and snack. Try to eat about 300 mg of calcium at each meal. Foods that contain 200-500 mg of calcium per serving include: ? 8 oz (237 ml) of milk, fortified nondairy milk, and fortified fruit juice. ? 8 oz (237 ml) of kefir, yogurt, and soy yogurt. ? 4 oz (118 ml) of tofu. ? 1 oz of cheese. ? 1 cup (300 g) of dried figs. ? 1 cup (91 g) of cooked broccoli. ? 1-3 oz can of sardines or mackerel.  Most people need 1000 to 1500 mg of calcium each day. Talk to your dietitian about how much calcium is recommended for you. Shopping  Buy plenty of fresh fruits and vegetables. Most people do not need to avoid fruits and vegetables, even if they contain nutrients that may contribute to kidney stones.  When shopping for convenience foods, choose: ? Whole pieces of fruit. ? Premade salads with dressing on the side. ? Low-fat fruit and yogurt smoothies.  Avoid buying frozen meals or prepared deli foods.  Look for foods with live cultures, such as yogurt and kefir. Cooking  Do not add salt to food when cooking. Place a salt shaker on the table and allow each person to add his or her own salt to taste.  Use vegetable protein, such as beans, textured vegetable  protein (TVP), or tofu instead of meat in pasta, casseroles, and soups. Meal planning   Eat less salt, if told by your dietitian. To do this: ? Avoid eating processed or premade food. ? Avoid eating fast food.  Eat less animal protein, including cheese, meat, poultry, or fish, if told by your dietitian. To do this: ? Limit the number of times you have meat, poultry, fish, or cheese each week. Eat a diet free of meat at least 2 days a week. ? Eat only one serving each day of meat, poultry, fish, or seafood. ? When you prepare animal protein, cut pieces into small portion sizes. For most meat and fish, one serving is about the size of one deck of cards.  Eat at least 5 servings of fresh fruits and vegetables each day. To do this: ? Keep fruits and vegetables on hand for snacks. ? Eat 1 piece of fruit or a handful of berries with breakfast. ? Have a salad and fruit at lunch. ? Have two kinds of vegetables at dinner.  Limit foods that are high in a substance called oxalate. These include: ? Spinach. ? Rhubarb. ? Beets. ? Potato chips and french fries. ? Nuts.  If you regularly take a diuretic medicine, make sure to eat at least 1-2 fruits or vegetables high in potassium each day. These include: ? Avocado. ? Banana. ? Orange, prune, carrot, or tomato juice. ? Baked potato. ? Cabbage. ? Beans and split   If you regularly take a diuretic medicine, make sure to eat at least 1-2 fruits or vegetables high in potassium each day. These include:  ? Avocado.  ? Banana.  ? Orange, prune, carrot, or tomato juice.  ? Baked potato.  ? Cabbage.  ? Beans and split peas.  General instructions     Drink enough fluid to keep your urine clear or pale yellow. This is the most important thing you can do.   Talk to your health care provider and dietitian about taking daily supplements. Depending on your health and the cause of your kidney stones, you may be advised:  ? Not to take supplements with vitamin C.  ? To take a calcium supplement.  ? To take a daily probiotic supplement.  ? To take other supplements such as magnesium, fish oil, or vitamin B6.   Take all medicines and supplements as told by your health care provider.   Limit alcohol intake to no  more than 1 drink a day for nonpregnant women and 2 drinks a day for men. One drink equals 12 oz of beer, 5 oz of wine, or 1 oz of hard liquor.   Lose weight if told by your health care provider. Work with your dietitian to find strategies and an eating plan that works best for you.  What foods are not recommended?  Limit your intake of the following foods, or as told by your dietitian. Talk to your dietitian about specific foods you should avoid based on the type of kidney stones and your overall health.  Grains  Breads. Bagels. Rolls. Baked goods. Salted crackers. Cereal. Pasta.  Vegetables  Spinach. Rhubarb. Beets. Canned vegetables. Pickles. Olives.  Meats and other protein foods  Nuts. Nut butters. Large portions of meat, poultry, or fish. Salted or cured meats. Deli meats. Hot dogs. Sausages.  Dairy  Cheese.  Beverages  Regular soft drinks. Regular vegetable juice.  Seasonings and other foods  Seasoning blends with salt. Salad dressings. Canned soups. Soy sauce. Ketchup. Barbecue sauce. Canned pasta sauce. Casseroles. Pizza. Lasagna. Frozen meals. Potato chips. French fries.  Summary   You can reduce your risk of kidney stones by making changes to your diet.   The most important thing you can do is drink enough fluid. You should drink enough fluid to keep your urine clear or pale yellow.   Ask your health care provider or dietitian how much protein from animal sources you should eat each day, and also how much salt and calcium you should have each day.  This information is not intended to replace advice given to you by your health care provider. Make sure you discuss any questions you have with your health care provider.  Document Released: 04/23/2010 Document Revised: 12/08/2015 Document Reviewed: 12/08/2015  Elsevier Interactive Patient Education  2019 [employer]

## 2018-02-16 ENCOUNTER — Ambulatory Visit
Admission: RE | Admit: 2018-02-16 | Discharge: 2018-02-16 | Disposition: A | Discharge status: Home / Self Care | Payer: Medicare HMO | Source: Ambulatory Visit | Attending: Urology | Admitting: Urology

## 2018-02-16 DIAGNOSIS — N200 Calculus of kidney: Secondary | ICD-10-CM

## 2018-02-19 ENCOUNTER — Telehealth: Payer: Self-pay | Admitting: Family Medicine

## 2018-02-19 NOTE — Telephone Encounter (Signed)
Patient notified and voiced understanding.

## 2018-02-19 NOTE — Telephone Encounter (Signed)
-----   Message from Abbie Sons, MD sent at 02/18/2018  1:37 PM EST ----- KUB shows small, calculi in each kidney.  Can continue to monitor

## 2018-02-20 ENCOUNTER — Other Ambulatory Visit: Payer: Self-pay | Admitting: Internal Medicine

## 2018-02-20 DIAGNOSIS — N5089 Other specified disorders of the male genital organs: Secondary | ICD-10-CM

## 2018-02-26 ENCOUNTER — Ambulatory Visit
Admission: RE | Admit: 2018-02-26 | Discharge: 2018-02-26 | Disposition: A | Discharge status: Home / Self Care | Payer: Medicare HMO | Source: Ambulatory Visit | Attending: Internal Medicine | Admitting: Internal Medicine

## 2018-02-26 DIAGNOSIS — N5089 Other specified disorders of the male genital organs: Secondary | ICD-10-CM | POA: Diagnosis not present

## 2018-02-27 ENCOUNTER — Other Ambulatory Visit: Payer: Self-pay | Admitting: Internal Medicine

## 2018-02-27 DIAGNOSIS — N5089 Other specified disorders of the male genital organs: Secondary | ICD-10-CM

## 2018-02-28 ENCOUNTER — Encounter: Payer: Self-pay | Admitting: Urology

## 2018-03-07 ENCOUNTER — Ambulatory Visit
Admission: RE | Admit: 2018-03-07 | Discharge: 2018-03-07 | Disposition: A | Discharge status: Home / Self Care | Payer: Medicare HMO | Source: Ambulatory Visit | Attending: Internal Medicine | Admitting: Internal Medicine

## 2018-03-07 DIAGNOSIS — N5089 Other specified disorders of the male genital organs: Secondary | ICD-10-CM | POA: Diagnosis present

## 2018-03-07 MED ORDER — IOHEXOL 300 MG/ML  SOLN
100.0000 mL | Freq: Once | INTRAMUSCULAR | Status: AC | PRN
  Administered 2018-03-07: 100 mL via INTRAVENOUS

## 2019-02-05 ENCOUNTER — Other Ambulatory Visit: Payer: Self-pay | Admitting: *Deleted

## 2019-02-05 DIAGNOSIS — N200 Calculus of kidney: Secondary | ICD-10-CM

## 2019-02-15 ENCOUNTER — Ambulatory Visit
Admission: RE | Admit: 2019-02-15 | Discharge: 2019-02-15 | Disposition: A | Discharge status: Home / Self Care | Payer: Medicare HMO | Source: Ambulatory Visit | Attending: Urology | Admitting: Urology

## 2019-02-15 ENCOUNTER — Encounter: Payer: Self-pay | Admitting: Urology

## 2019-02-15 ENCOUNTER — Ambulatory Visit (INDEPENDENT_AMBULATORY_CARE_PROVIDER_SITE_OTHER): Payer: Medicare HMO | Admitting: Urology

## 2019-02-15 ENCOUNTER — Other Ambulatory Visit: Payer: Self-pay

## 2019-02-15 VITALS — BP 157/80 | HR 80 | Ht 70.0 in | Wt 190.0 lb

## 2019-02-15 DIAGNOSIS — N200 Calculus of kidney: Secondary | ICD-10-CM | POA: Insufficient documentation

## 2019-02-15 NOTE — Progress Notes (Signed)
02/15/2019 4:05 PM   George [name] Jan 02, 1951 [id number]  Referring provider: Adin Hector, MD [facility] [place],  [place] [address]  Chief Complaint  Patient presents with  . Nephrolithiasis     Urologic history: 1.  Recurrent stone disease             -Bilateral nonobstructing nephrolithiasis   HPI: 69 year old male presents for annual follow-up of nephrolithiasis. He has done well the last 12 months and denies flank, abdominal or pelvic pain. No dysuria or gross hematuria. He has no bothersome lower urinary tract symptoms.  Lab work performed yesterday at [facility] with PSA stable at 1.89 and a negative UA.  KUB performed today shows stable bilateral nephrolithiasis. The largest stone in the right lower pole at 5 mm.   PMH: Past Medical History:  Diagnosis Date  . Hyperlipemia   . Hypertension   . Kidney stones     Surgical History: Past Surgical History:  Procedure Laterality Date  . COLONOSCOPY WITH PROPOFOL N/A 01/29/2016   Procedure: COLONOSCOPY WITH PROPOFOL;  Surgeon: Manya Silvas, MD;  Location: [facility] ENDOSCOPY;  Service: Endoscopy;  Laterality: N/A;  . CORONARY ANGIOPLASTY    . HERNIA REPAIR    . KIDNEY STONE SURGERY      Home Medications:  Allergies as of 02/15/2019      Reactions   Contrast Media [iodinated Diagnostic Agents]    Penicillins       Medication List       Accurate as of February 15, 2019  4:05 PM. If you have any questions, ask your nurse or doctor.        aspirin EC 81 MG tablet Take 81 mg by mouth daily.   cetirizine 10 MG tablet Commonly known as: ZYRTEC Take 10 mg by mouth daily as needed for allergies.   omeprazole 40 MG capsule Commonly known as: PRILOSEC   quinapril 20 MG tablet Commonly known as: ACCUPRIL Take 20 mg by mouth daily.   rosuvastatin 40 MG tablet Commonly known as: CRESTOR   tadalafil 20 MG tablet Commonly known as: CIALIS   tamsulosin 0.4 MG Caps  capsule Commonly known as: Flomax Take 1 capsule (0.4 mg total) by mouth daily.   VITAMIN B 12 PO Take 1 tablet by mouth daily.       Allergies:  Allergies  Allergen Reactions  . Contrast Media [Iodinated Diagnostic Agents]   . Penicillins     Family History: History reviewed. No pertinent family history.  Social History:  reports that he has never smoked. He has never used smokeless tobacco. He reports current alcohol use. He reports that he does not use drugs.  ROS: UROLOGY Frequent Urination?: No Hard to postpone urination?: No Burning/pain with urination?: No Get up at night to urinate?: No Leakage of urine?: No Urine stream starts and stops?: No Trouble starting stream?: No Do you have to strain to urinate?: No Blood in urine?: No Urinary tract infection?: No Sexually transmitted disease?: No Injury to kidneys or bladder?: No Painful intercourse?: No Weak stream?: No Erection problems?: No Penile pain?: No  Gastrointestinal Nausea?: No Vomiting?: No Indigestion/heartburn?: No Diarrhea?: No Constipation?: No  Constitutional Fever: No Night sweats?: No Weight loss?: No Fatigue?: No  Skin Skin rash/lesions?: No Itching?: No  Eyes Blurred vision?: No Double vision?: No  Ears/Nose/Throat Sore throat?: No Sinus problems?: No  Hematologic/Lymphatic Swollen glands?: No Easy bruising?: No  Cardiovascular Leg swelling?: No Chest pain?:  No  Respiratory Cough?: No Shortness of breath?: No  Endocrine Excessive thirst?: No  Musculoskeletal Back pain?: No Joint pain?: No  Neurological Headaches?: No Dizziness?: No  Psychologic Depression?: No Anxiety?: No  Physical Exam: BP (!) 157/80   Pulse 80   Ht 5\' 10"  (1.778 m)   Wt 190 lb (86.2 kg)   BMI 27.26 kg/m   Constitutional:  Alert and oriented, No acute distress. HEENT: [place] AT, moist mucus membranes.  Trachea midline, no masses. Cardiovascular: No clubbing, cyanosis, or  edema. Respiratory: Normal respiratory effort, no increased work of breathing. GI: Abdomen is soft, nontender, nondistended, no abdominal masses GU: Prostate 40 g, smooth without nodules Lymph: No cervical or inguinal lymphadenopathy. Skin: No rashes, bruises or suspicious lesions. Neurologic: Grossly intact, no focal deficits, moving all 4 extremities. Psychiatric: Normal mood and affect.   Assessment & Plan:    - Bilateral nephrolithiasis Stable bilateral nephrolithiasis. He has done a great job limiting foods high urine oxalate. He has had increased problem with hemorrhoids due to limiting higher fiber foods that are high in oxalate. He should be fine to increase his fiber since that does control his hemorrhoidal flares. Discussed obtaining a 24-hour urine to check his oxalate levels if desired.  Continue annual follow-up.    Abbie Sons, [place] [address] [place], [place] [address] [phone]

## 2019-02-17 ENCOUNTER — Encounter: Payer: Self-pay | Admitting: Urology

## 2019-02-22 IMAGING — CR DG ABDOMEN 1V
1 series · 2 of 2 positions shown · non-contrast
Comparison: None.

CLINICAL DATA: Nephrolithiasis

EXAM:
ABDOMEN - 1 VIEW

[Series 1: dg abd 1 view · 0.14mm/px · 2 of 2 slices shown]
[im 1/2]
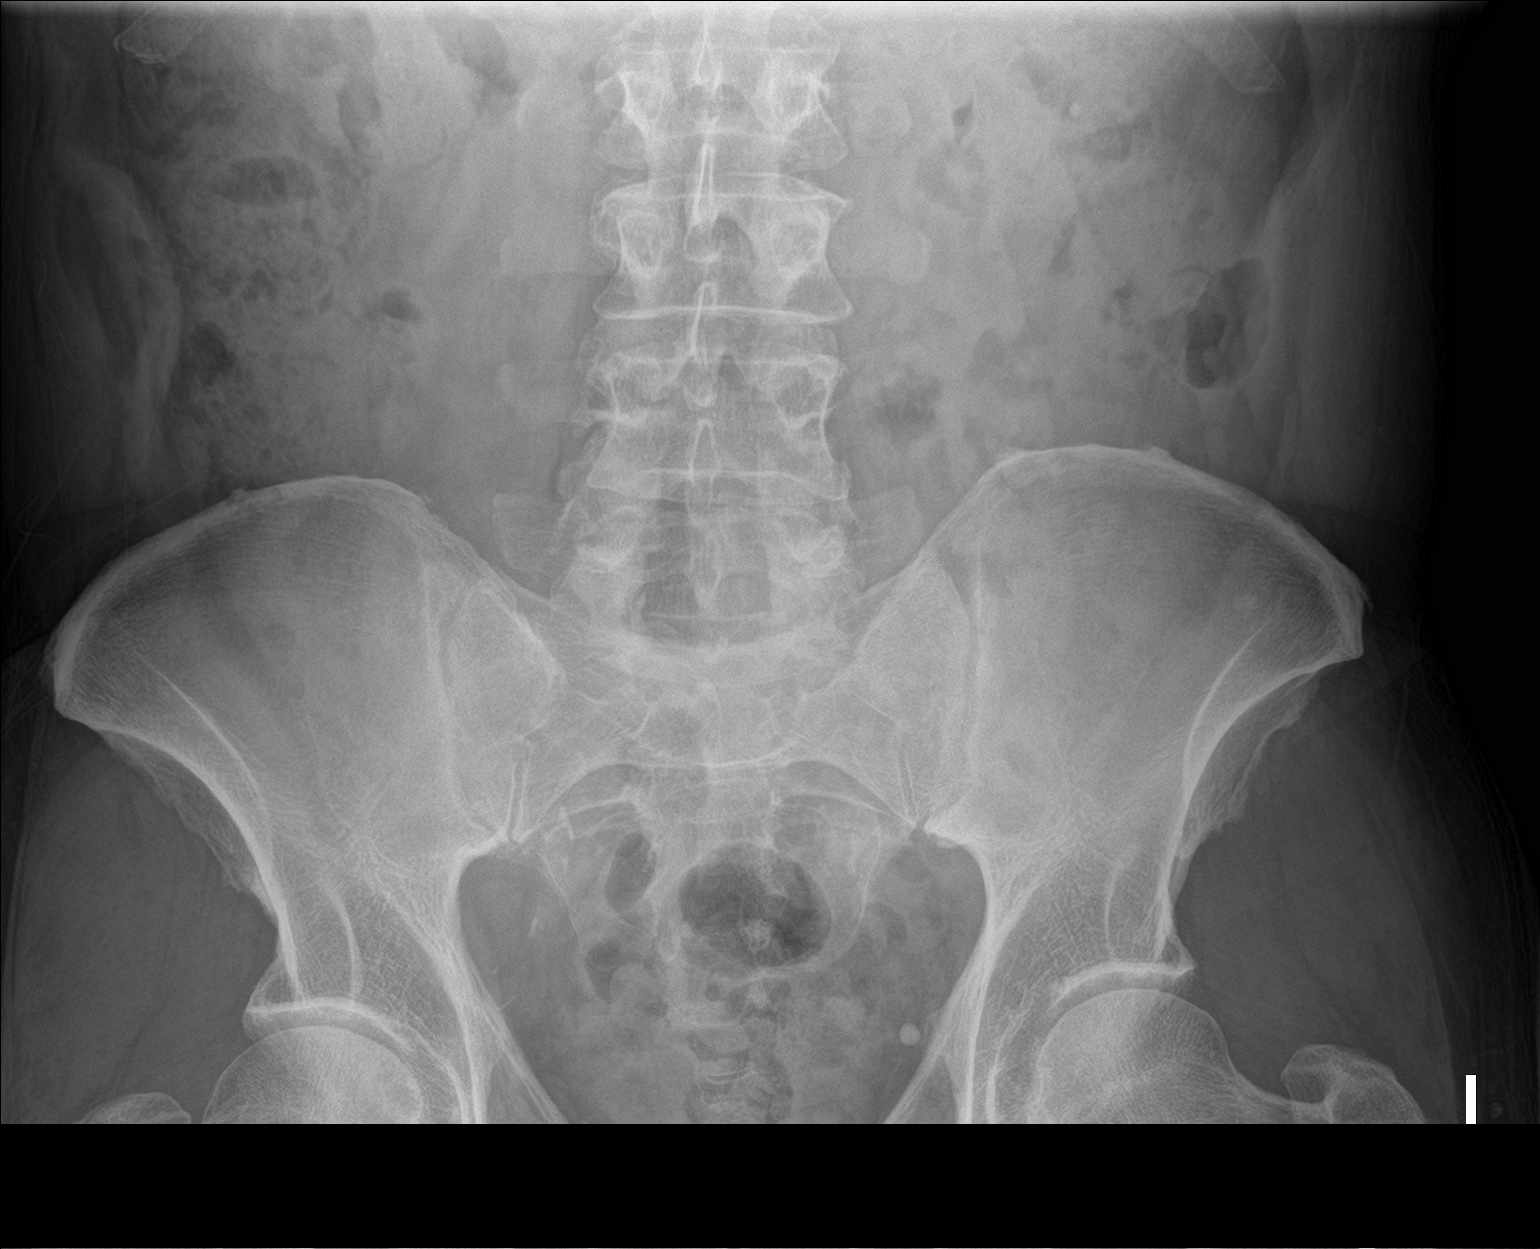
[im 2/2]
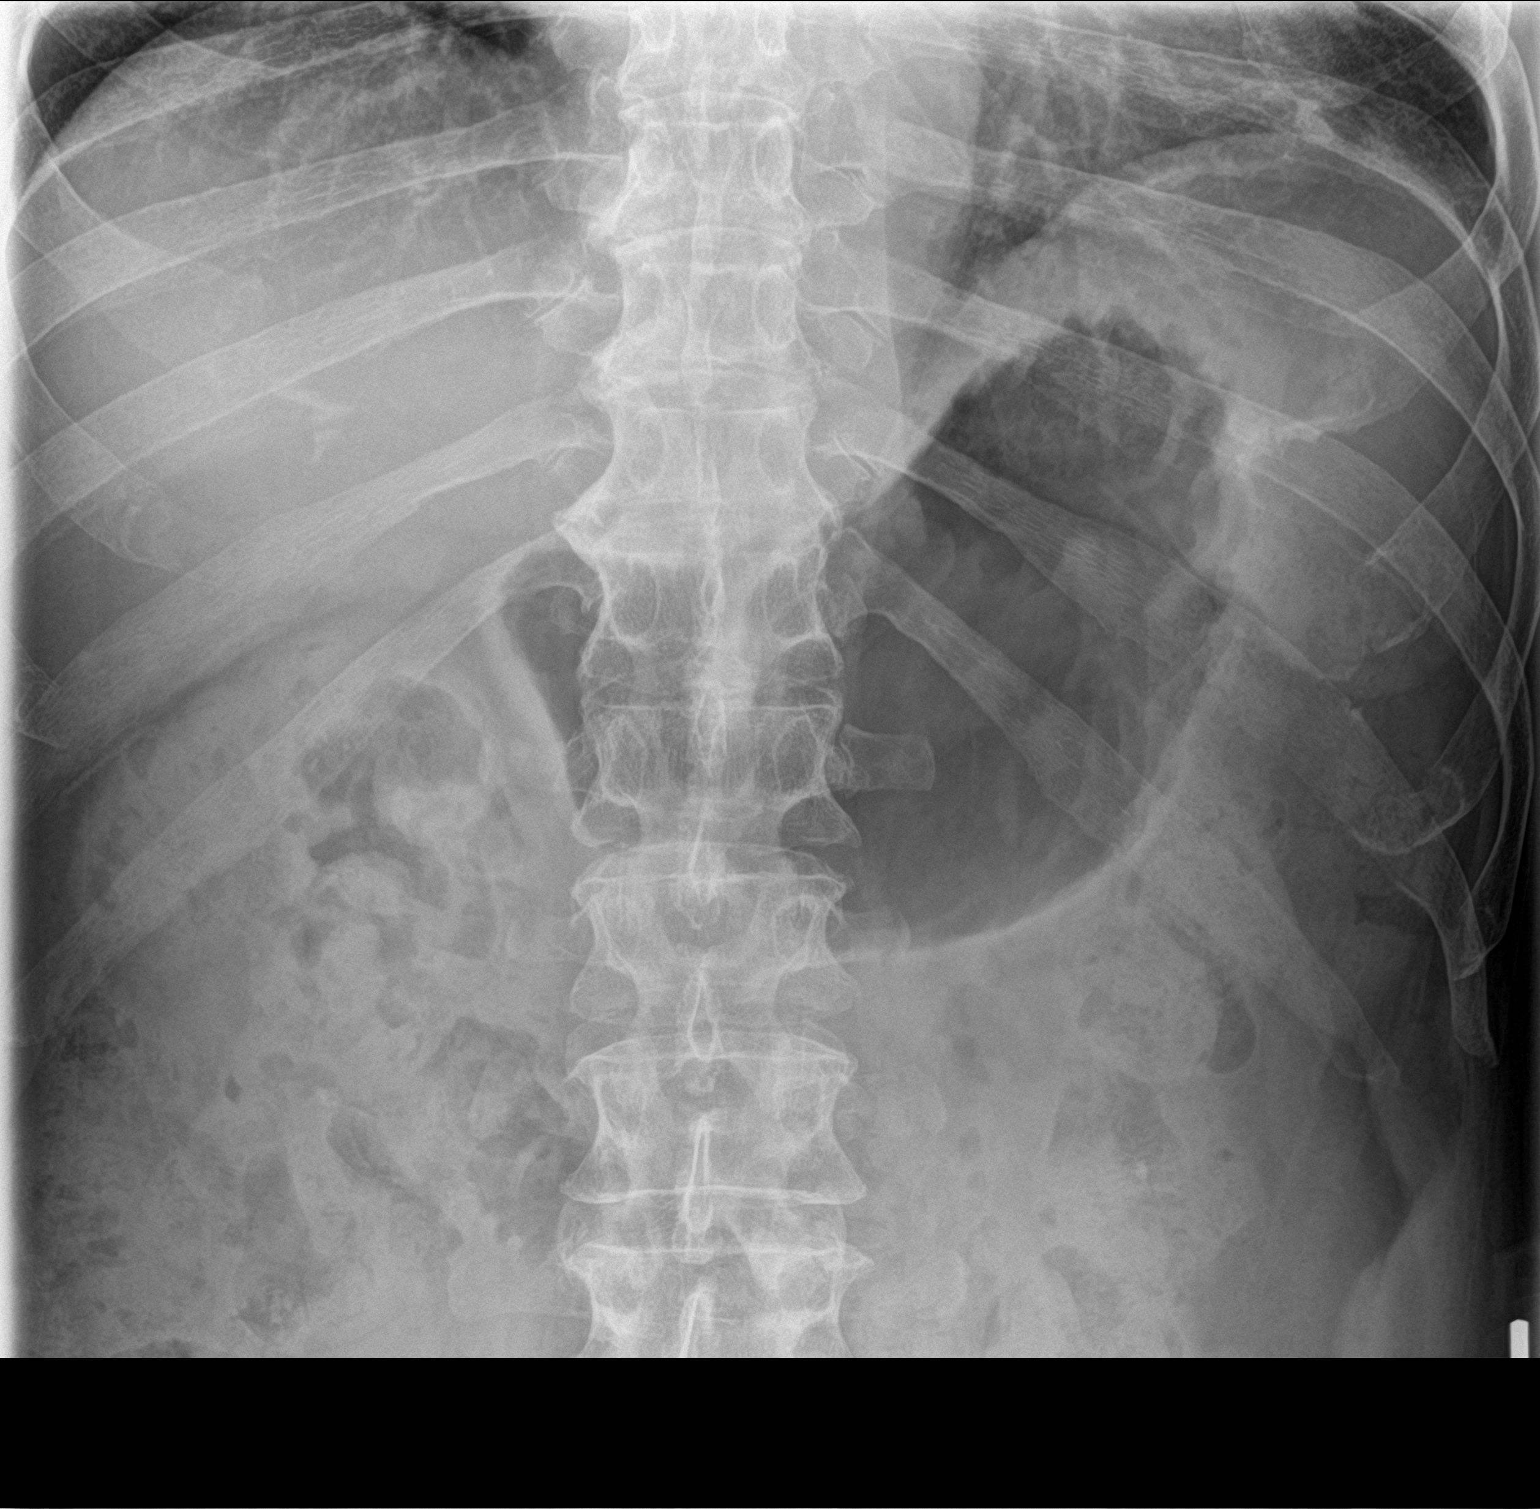

[2 of 2 positions shown; findings below may reference images not displayed]

FINDINGS: Small (2 mm) LEFT renal calculus noted. Small (4 mm) no bladder
calculi RIGHT renal calculus identified. No clear ureterolithiasis.
IMPRESSION: Bilateral nephrolithiasis.  No ureterolithiasis is identified.

## 2020-02-17 ENCOUNTER — Ambulatory Visit
Admission: RE | Admit: 2020-02-17 | Discharge: 2020-02-17 | Disposition: A | Discharge status: Home / Self Care | Payer: Medicare HMO | Source: Ambulatory Visit | Attending: Urology | Admitting: Urology

## 2020-02-17 ENCOUNTER — Encounter: Payer: Self-pay | Admitting: Urology

## 2020-02-17 ENCOUNTER — Other Ambulatory Visit: Payer: Self-pay | Admitting: *Deleted

## 2020-02-17 ENCOUNTER — Other Ambulatory Visit: Payer: Self-pay

## 2020-02-17 ENCOUNTER — Ambulatory Visit: Payer: Medicare HMO | Admitting: Urology

## 2020-02-17 VITALS — BP 156/91 | HR 74 | Ht 70.0 in | Wt 192.0 lb

## 2020-02-17 DIAGNOSIS — N200 Calculus of kidney: Secondary | ICD-10-CM | POA: Insufficient documentation

## 2020-02-17 NOTE — Progress Notes (Signed)
   02/17/2020 1:15 PM   George [name] Dec 08, 1950 [id number]  Referring provider: Adin Hector, MD [facility] [place],  [place] [address]  Chief Complaint  Patient presents with  . Nephrolithiasis     Urologic history: 1.Recurrent stone disease -Bilateral nonobstructing nephrolithiasis  HPI: 70 y.o. male presents for annual follow-up.   No problems since last years visit  Denies recurrent renal colic, flank or abdominal pain  No dysuria or gross hematuria  KUB performed today reviewed and stable, bilateral calcifications overlying the renal outlines not significantly changed from KUB of last year   PMH: Past Medical History:  Diagnosis Date  . Hyperlipemia   . Hypertension   . Kidney stones     Surgical History: Past Surgical History:  Procedure Laterality Date  . COLONOSCOPY WITH PROPOFOL N/A 01/29/2016   Procedure: COLONOSCOPY WITH PROPOFOL;  Surgeon: Manya Silvas, MD;  Location: [facility] ENDOSCOPY;  Service: Endoscopy;  Laterality: N/A;  . CORONARY ANGIOPLASTY    . HERNIA REPAIR    . KIDNEY STONE SURGERY      Home Medications:  Allergies as of 02/17/2020      Reactions   Contrast Media [iodinated Diagnostic Agents]    Penicillins       Medication List       Accurate as of February 17, 2020  1:15 PM. If you have any questions, ask your nurse or doctor.        aspirin EC 81 MG tablet Take 81 mg by mouth daily.   cetirizine 10 MG tablet Commonly known as: ZYRTEC Take 10 mg by mouth daily as needed for allergies.   omeprazole 40 MG capsule Commonly known as: PRILOSEC   quinapril 20 MG tablet Commonly known as: ACCUPRIL Take 20 mg by mouth daily.   rosuvastatin 40 MG tablet Commonly known as: CRESTOR   tadalafil 20 MG tablet Commonly known as: CIALIS   tamsulosin 0.4 MG Caps capsule Commonly known as: Flomax Take 1 capsule (0.4 mg total) by mouth daily.   VITAMIN B 12 PO Take 1 tablet by  mouth daily.       Allergies:  Allergies  Allergen Reactions  . Contrast Media [Iodinated Diagnostic Agents]   . Penicillins     Family History: No family history on file.  Social History:  reports that he has never smoked. He has never used smokeless tobacco. He reports current alcohol use. He reports that he does not use drugs.   Physical Exam: There were no vitals taken for this visit.  Constitutional:  Alert and oriented, No acute distress. HEENT: [place] AT, moist mucus membranes.  Trachea midline, no masses. Cardiovascular: No clubbing, cyanosis, or edema. Respiratory: Normal respiratory effort, no increased work of breathing. GU: Prostate 40 g, smooth without nodules Skin: No rashes, bruises or suspicious lesions. Neurologic: Grossly intact, no focal deficits, moving all 4 extremities. Psychiatric: Normal mood and affect.   Pertinent Imaging: KUB today personally reviewed and interpreted as per the HPI   Assessment & Plan:    1. Bilateral nephrolithiasis  Stable  Follow-up 1 year with KUB  Call earlier for any stone pain, recurrent renal colic   Abbie Sons, MD  [place] [address] [place], [place] [address] [phone]

## 2020-02-18 ENCOUNTER — Encounter: Payer: Self-pay | Admitting: Urology

## 2020-02-23 IMAGING — CR DG ABDOMEN 1V
1 series · 2 of 2 positions shown · non-contrast
Comparison: 02/15/2017

CLINICAL DATA: Kidney stones

EXAM:
ABDOMEN - 1 VIEW

[Series 1: dg abd 1 view · 0.14mm/px · 2 of 2 slices shown]
[im 1/2]
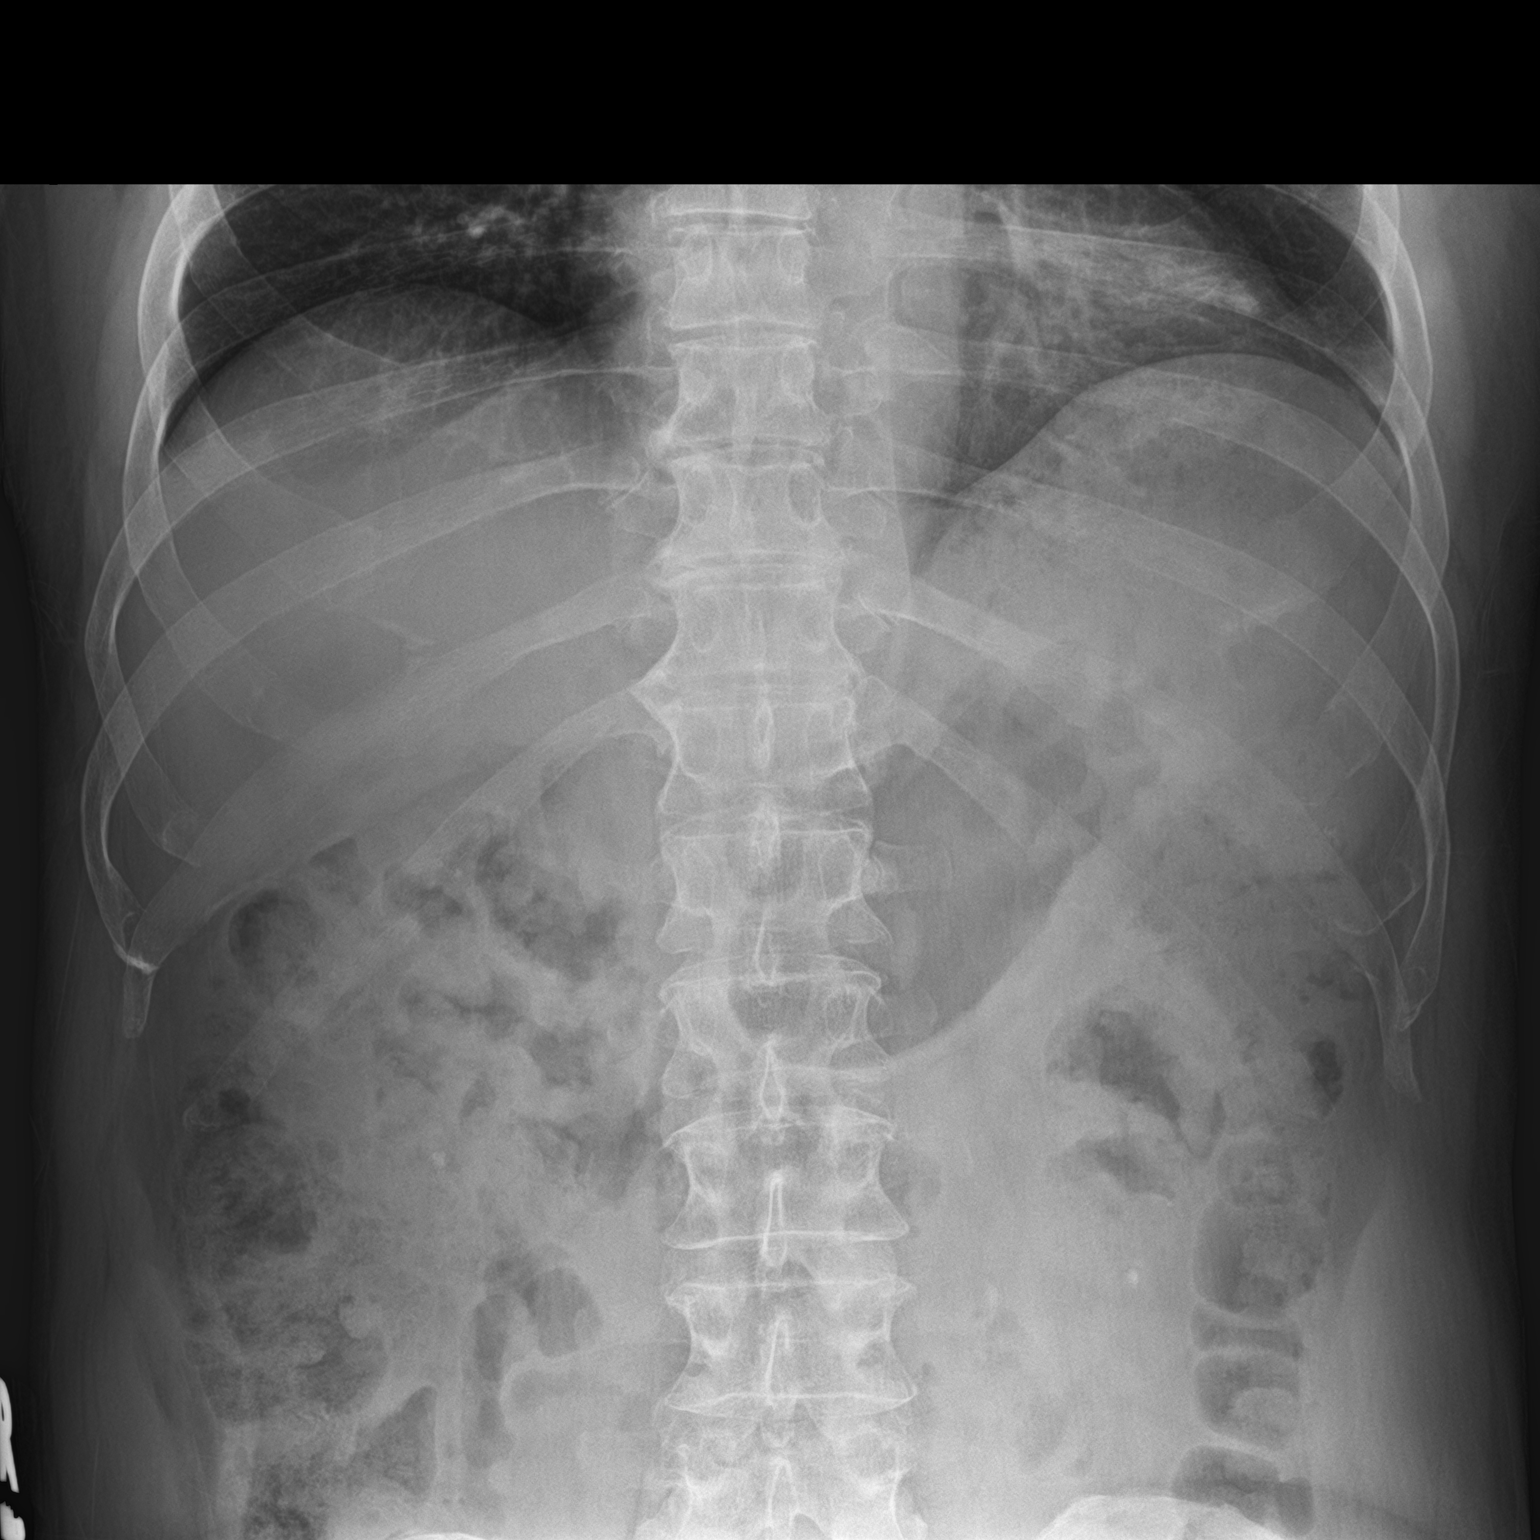
[im 2/2]
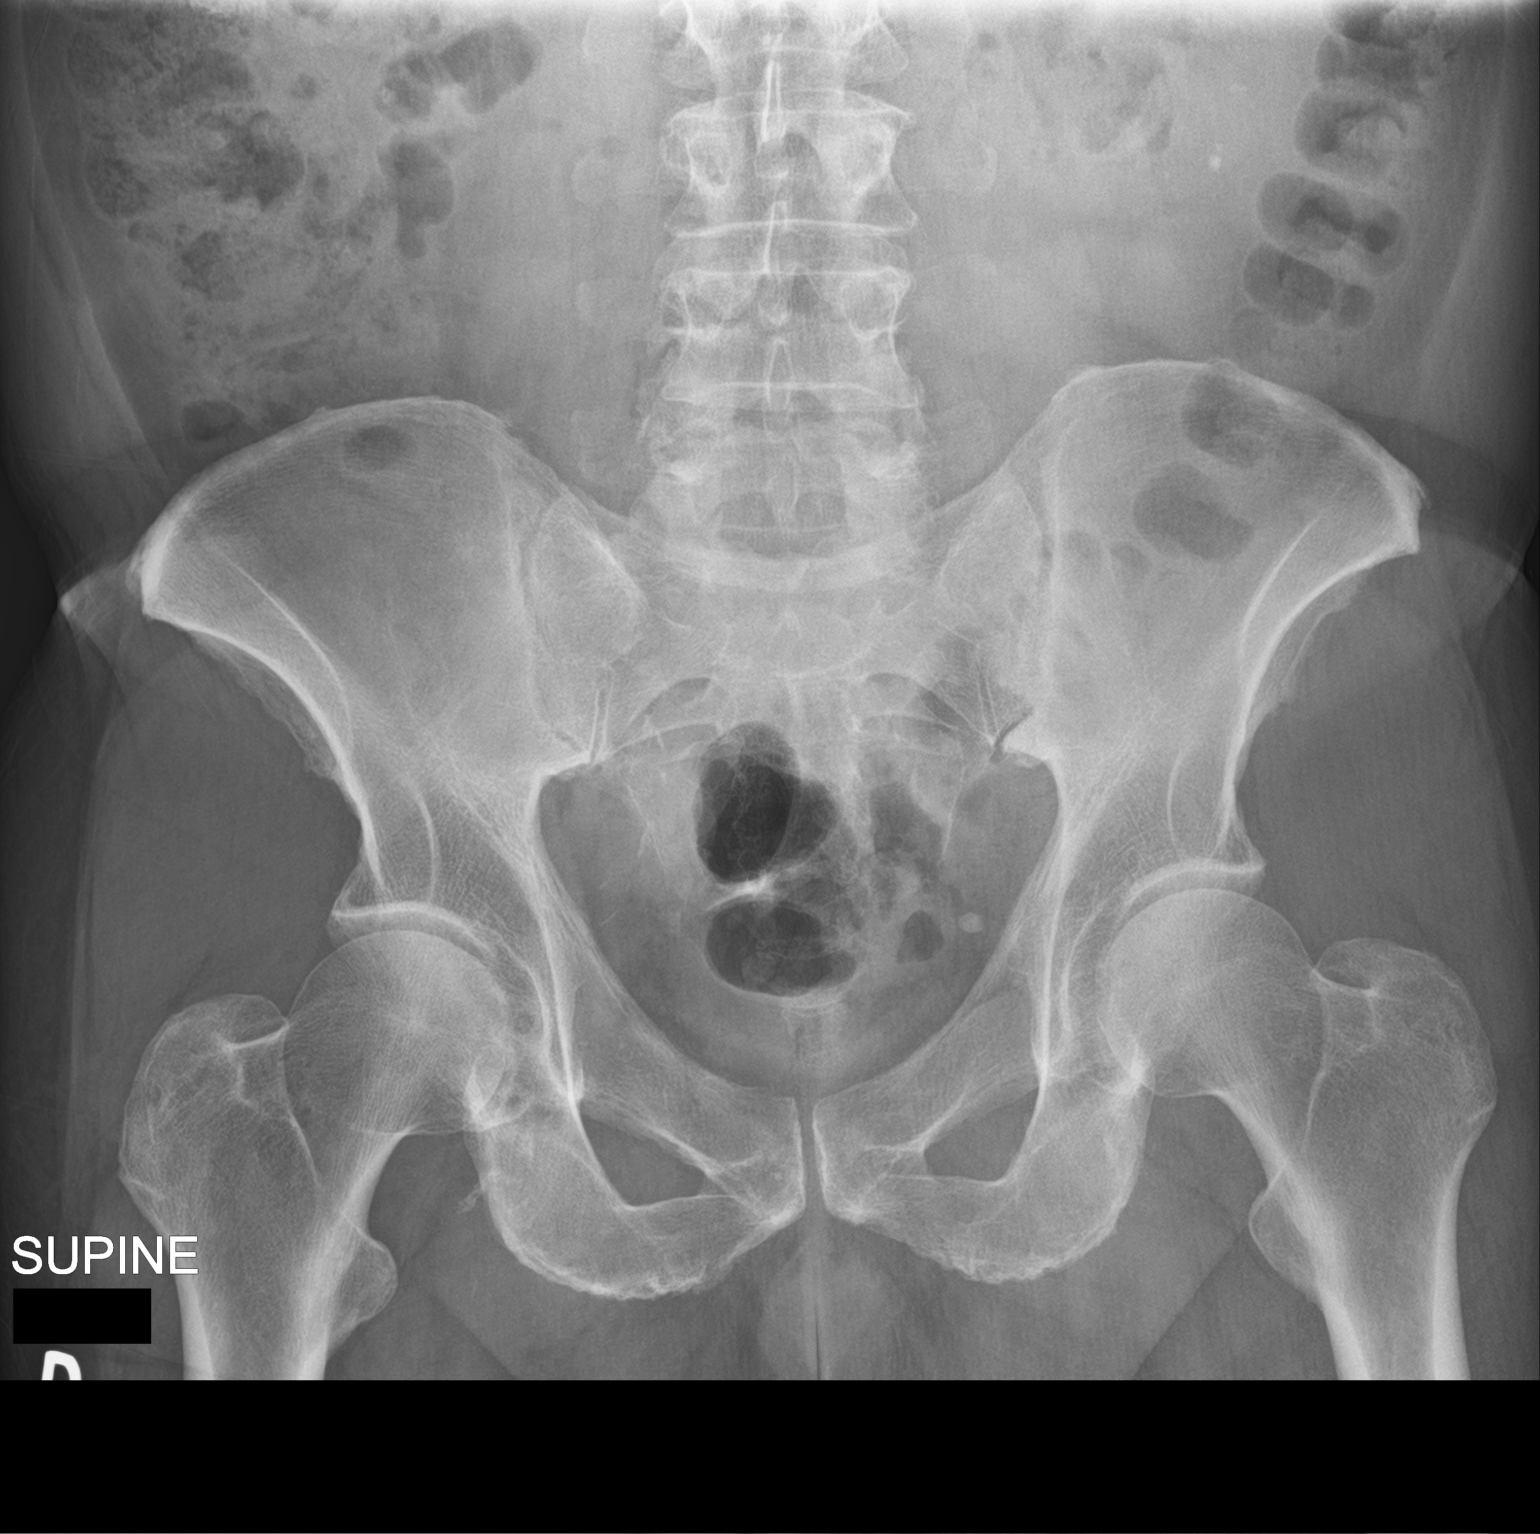

[2 of 2 positions shown; findings below may reference images not displayed]

FINDINGS: Small calculi projecting over the lower poles of both kidneys are
stable. There are no disproportionally dilated loops of bowel. There
is no obvious free intraperitoneal gas. Round calcification
projecting over the left hemipelvis is stable and likely a
phlebolith.
IMPRESSION: Bilateral nephrolithiasis.  Nonobstructive bowel gas pattern.

## 2020-03-04 IMAGING — US US SCROTUM W/ DOPPLER COMPLETE
1 series · 13 of 25 positions shown · non-contrast
Comparison: None

CLINICAL DATA: RIGHT scrotal mass

EXAM:
SCROTAL ULTRASOUND
DOPPLER ULTRASOUND OF THE TESTICLES
TECHNIQUE: Complete ultrasound examination of the testicles, epididymis, and
other scrotal structures was performed. Color and spectral Doppler
ultrasound were also utilized to evaluate blood flow to the
testicles.

[Series 1: us scrotum w/ doppler complete · 44 acquisitions, 13 frames shown]
[im 1/44]
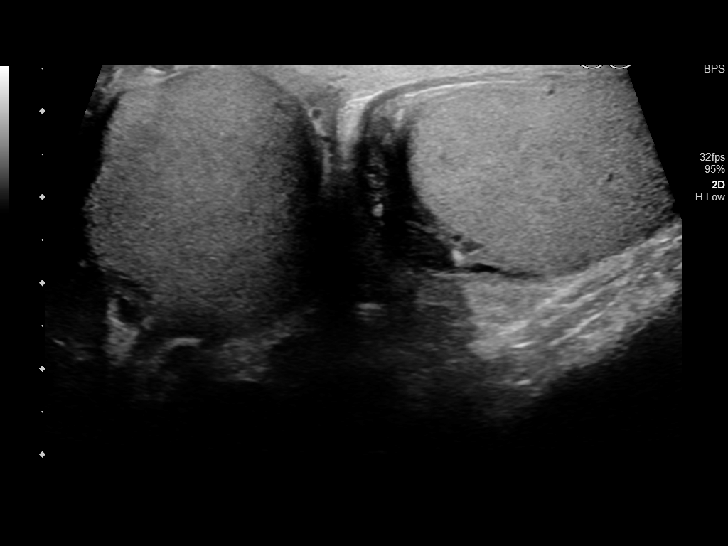
[im 4/44]
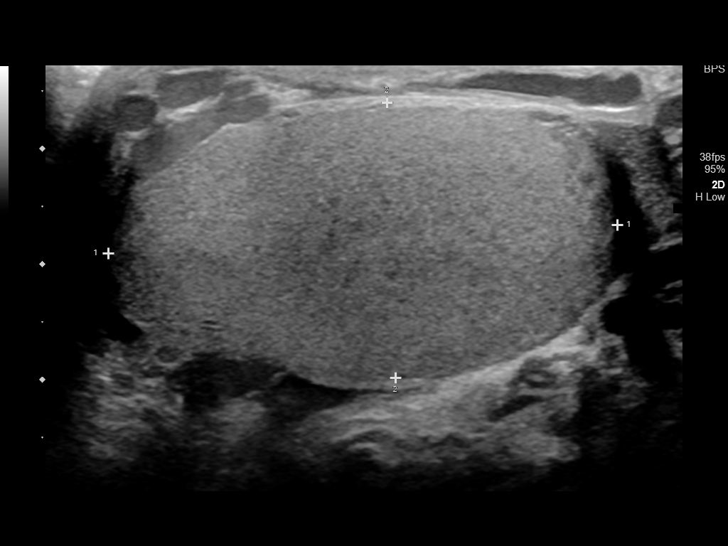
[im 8/44]
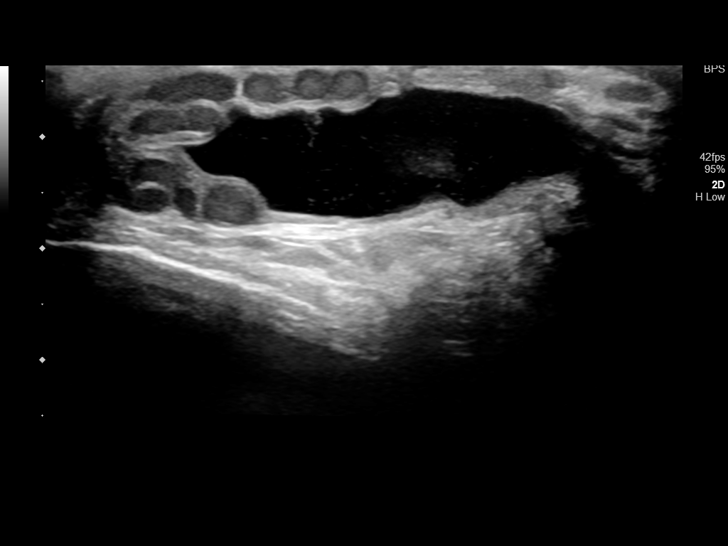
[im 11/44]
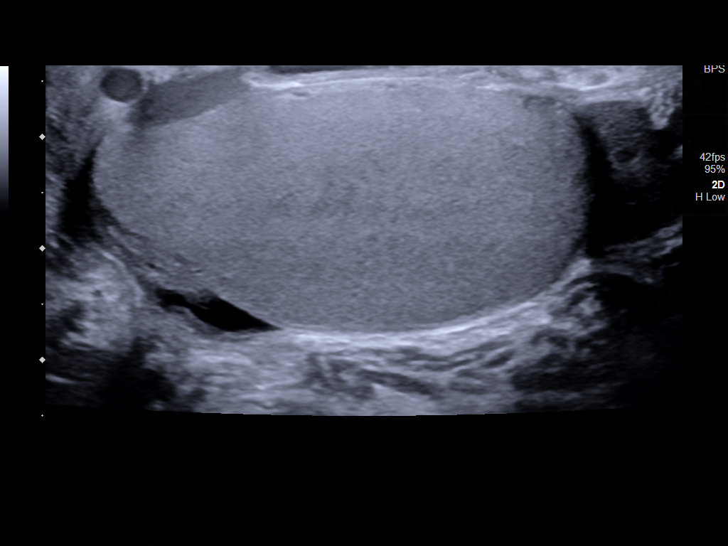
[im 15/44]
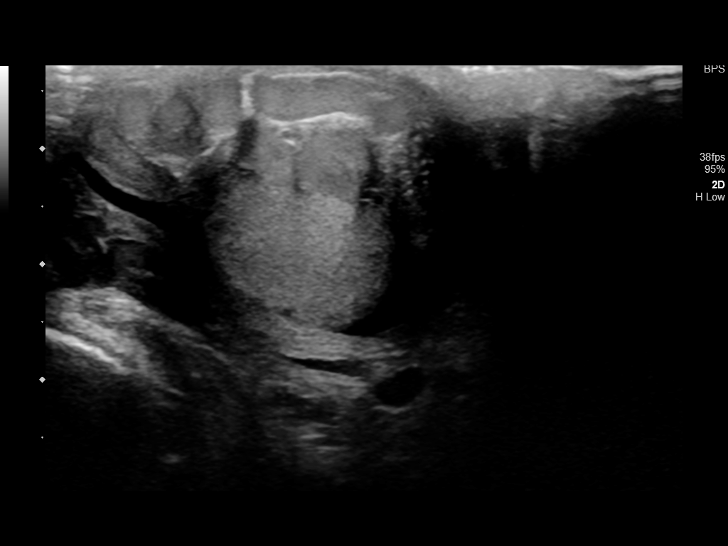
[im 18/44]
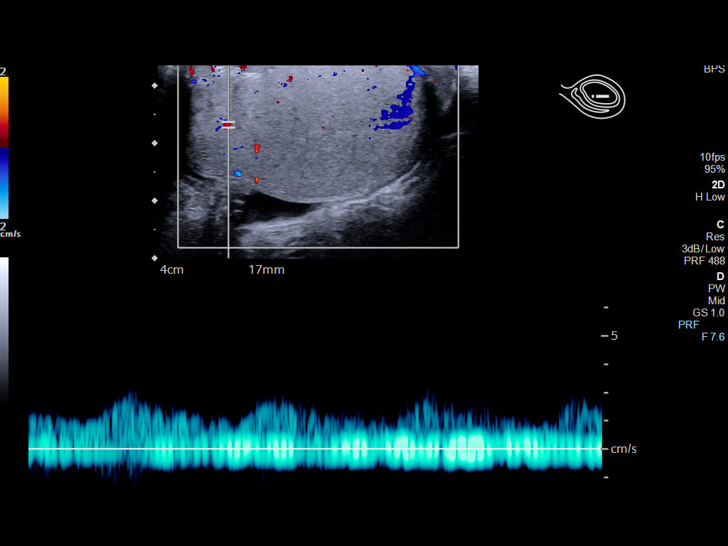
[im 22/44]
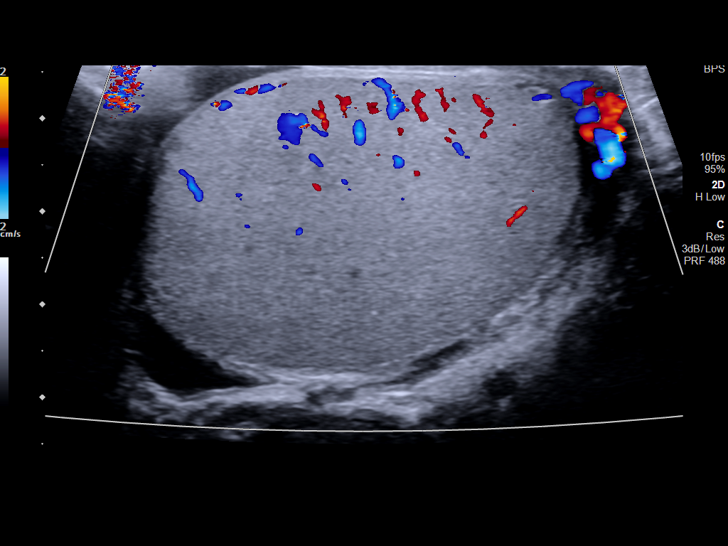
[im 26/44]
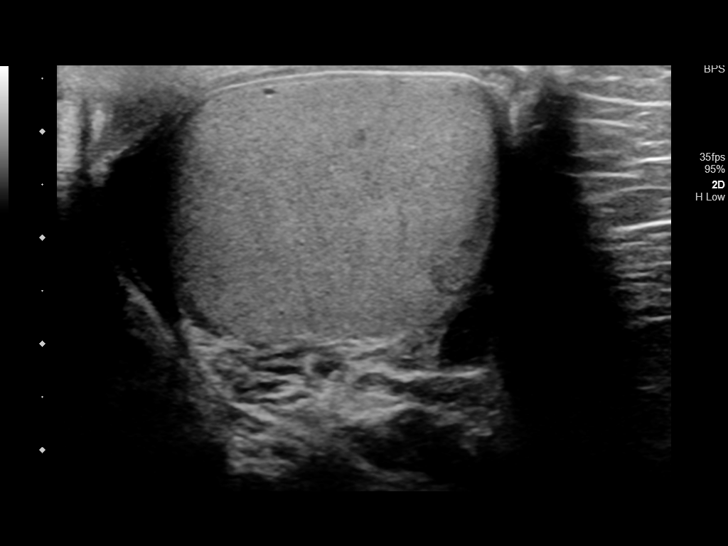
[im 29/44]
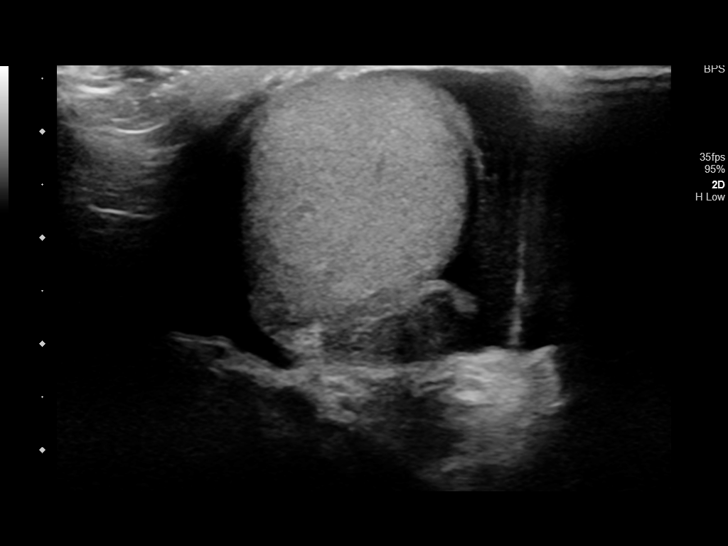
[im 33/44]
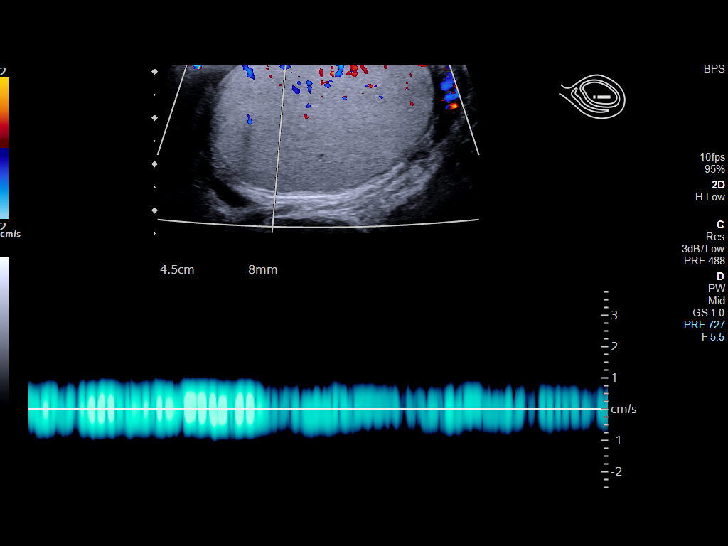
[im 36/44]
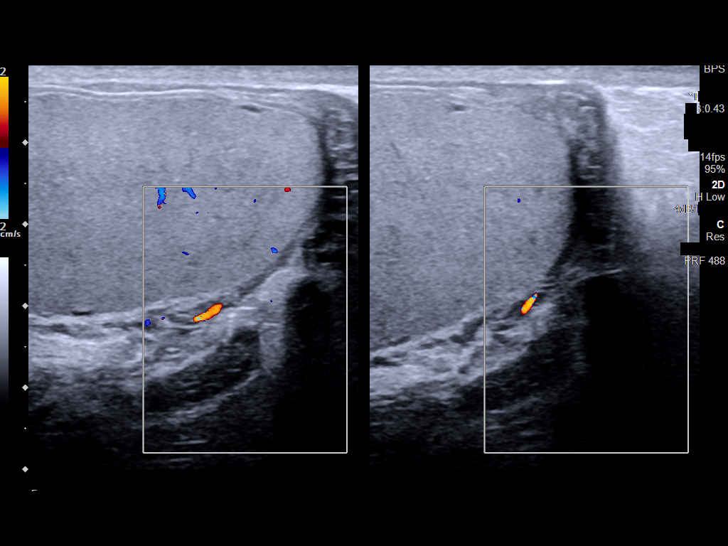
[im 40/44]
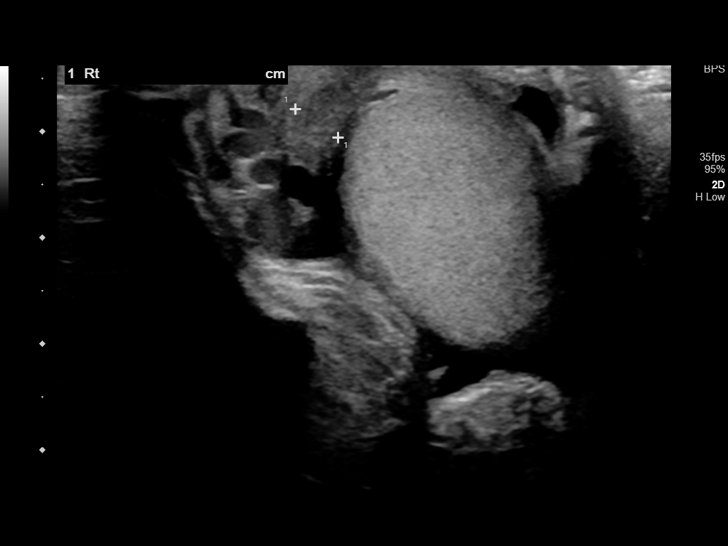
[im 44/44]
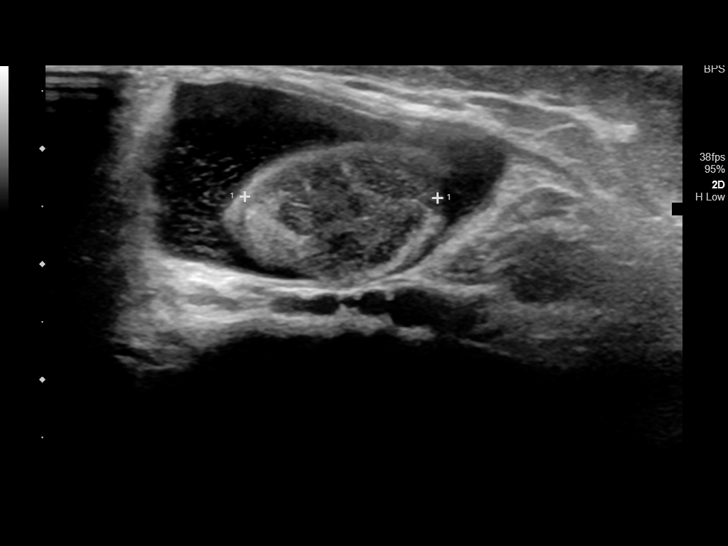

[13 of 25 positions shown; findings below may reference images not displayed]

FINDINGS: Right testicle

Measurements: 4.4 x 2.4 x 3.0 cm. Normal echogenicity without mass
or calcification. Internal blood flow present on color Doppler
imaging.

Left testicle

Measurements: 4.8 x 3.0 x 3.2 cm. Normal morphology without mass or
calcification. Internal blood flow present on color Doppler imaging,
symmetric with RIGHT

Right epididymis:  Normal in size and appearance.

Left epididymis:  Normal in size and appearance.

Hydrocele:  Small BILATERAL hydroceles

Varicocele: RIGHT-side varicocele present. No LEFT varicocele is
identified.

Pulsed Doppler interrogation of both testes demonstrates normal low
resistance arterial and venous waveforms bilaterally.
IMPRESSION: Normal appearing testes and epididymi.

Small BILATERAL hydroceles.

RIGHT-sided varicocele is present though no LEFT varicocele is
identified; isolated unilateral RIGHT-sided varicocele is an
uncommon finding, can be identified as a secondary etiology such as
from compression of the testicular vein due to mass, adenopathy, or
tumor, renal vein thrombosis, and splenorenal shunting from portal
hypertension; recommend follow-up CT imaging of the abdomen and
pelvis with IV and oral contrast to exclude secondary causes of
RIGHT-sided varicocele.

These results will be called to the ordering clinician or
representative by the Radiologist Assistant, and communication
documented in the PACS or zVision Dashboard.

## 2020-11-23 ENCOUNTER — Telehealth: Payer: Self-pay

## 2020-11-23 NOTE — Telephone Encounter (Signed)
Patient is ready to schedule procedure. Clinical staff will follow up with patient. °

## 2020-11-23 NOTE — Telephone Encounter (Signed)
Referral is in the workque.

## 2020-12-02 ENCOUNTER — Other Ambulatory Visit: Payer: Self-pay

## 2020-12-02 MED ORDER — NA SULFATE-K SULFATE-MG SULF 17.5-3.13-1.6 GM/177ML PO SOLN: 1.0000 | ORAL | 0 refills | Status: DC

## 2021-01-13 ENCOUNTER — Encounter: Payer: Self-pay | Admitting: Gastroenterology

## 2021-01-15 ENCOUNTER — Other Ambulatory Visit: Payer: Self-pay

## 2021-01-15 MED ORDER — NA SULFATE-K SULFATE-MG SULF 17.5-3.13-1.6 GM/177ML PO SOLN: 1.0000 | ORAL | 0 refills | Status: DC

## 2021-02-01 ENCOUNTER — Ambulatory Visit: Payer: Medicare HMO | Admitting: Anesthesiology

## 2021-02-01 ENCOUNTER — Encounter: Payer: Self-pay | Admitting: Gastroenterology

## 2021-02-01 ENCOUNTER — Encounter
Admission: RE | Disposition: A | Discharge status: Home / Self Care | Payer: Self-pay | Source: Home / Self Care | Attending: Gastroenterology

## 2021-02-01 ENCOUNTER — Other Ambulatory Visit: Payer: Self-pay

## 2021-02-01 ENCOUNTER — Ambulatory Visit
Admission: RE | Admit: 2021-02-01 | Discharge: 2021-02-01 | Disposition: A | Discharge status: Home / Self Care | Payer: Medicare HMO | Attending: Gastroenterology | Admitting: Gastroenterology

## 2021-02-01 DIAGNOSIS — I2510 Atherosclerotic heart disease of native coronary artery without angina pectoris: Secondary | ICD-10-CM | POA: Insufficient documentation

## 2021-02-01 DIAGNOSIS — K5730 Diverticulosis of large intestine without perforation or abscess without bleeding: Secondary | ICD-10-CM | POA: Insufficient documentation

## 2021-02-01 DIAGNOSIS — K219 Gastro-esophageal reflux disease without esophagitis: Secondary | ICD-10-CM | POA: Diagnosis not present

## 2021-02-01 DIAGNOSIS — K635 Polyp of colon: Secondary | ICD-10-CM | POA: Insufficient documentation

## 2021-02-01 DIAGNOSIS — K641 Second degree hemorrhoids: Secondary | ICD-10-CM | POA: Insufficient documentation

## 2021-02-01 DIAGNOSIS — I10 Essential (primary) hypertension: Secondary | ICD-10-CM | POA: Insufficient documentation

## 2021-02-01 DIAGNOSIS — Z860100 Personal history of colon polyps, unspecified: Secondary | ICD-10-CM

## 2021-02-01 DIAGNOSIS — K6389 Other specified diseases of intestine: Secondary | ICD-10-CM | POA: Diagnosis not present

## 2021-02-01 DIAGNOSIS — Z1211 Encounter for screening for malignant neoplasm of colon: Secondary | ICD-10-CM | POA: Diagnosis not present

## 2021-02-01 HISTORY — PX: POLYPECTOMY: SHX5525

## 2021-02-01 HISTORY — PX: COLONOSCOPY: SHX5424

## 2021-02-01 SURGERY — COLONOSCOPY
Anesthesia: General | Site: Rectum

## 2021-02-01 MED ORDER — PROPOFOL 10 MG/ML IV BOLUS
INTRAVENOUS | Status: DC | PRN
Start: 2021-02-01 — End: 2021-02-01
  Administered 2021-02-01: 40 mg via INTRAVENOUS
  Administered 2021-02-01 (×3): 20 mg via INTRAVENOUS
  Administered 2021-02-01: 40 mg via INTRAVENOUS
  Administered 2021-02-01: 100 mg via INTRAVENOUS
  Administered 2021-02-01: 20 mg via INTRAVENOUS

## 2021-02-01 MED ORDER — SODIUM CHLORIDE 0.9 % IV SOLN: INTRAVENOUS | Status: DC

## 2021-02-01 MED ORDER — LACTATED RINGERS IV SOLN: INTRAVENOUS | Status: DC

## 2021-02-01 MED ORDER — STERILE WATER FOR IRRIGATION IR SOLN
Status: DC | PRN
  Administered 2021-02-01: 1

## 2021-02-01 MED ORDER — ACETAMINOPHEN 325 MG PO TABS: 325.0000 mg | ORAL_TABLET | ORAL | Status: DC | PRN

## 2021-02-01 MED ORDER — LIDOCAINE HCL (CARDIAC) PF 100 MG/5ML IV SOSY
PREFILLED_SYRINGE | INTRAVENOUS | Status: DC | PRN
Start: 2021-02-01 — End: 2021-02-01
  Administered 2021-02-01: 30 mg via INTRAVENOUS

## 2021-02-01 MED ORDER — ACETAMINOPHEN 160 MG/5ML PO SOLN: 325.0000 mg | ORAL | Status: DC | PRN

## 2021-02-01 SURGICAL SUPPLY — 8 items
GOWN CVR UNV OPN BCK APRN NK (MISCELLANEOUS) ×2 IMPLANT
GOWN ISOL THUMB LOOP REG UNIV (MISCELLANEOUS) ×6
KIT PRC NS LF DISP ENDO (KITS) ×1 IMPLANT
KIT PROCEDURE OLYMPUS (KITS) ×3
MANIFOLD NEPTUNE II (INSTRUMENTS) ×3 IMPLANT
SNARE COLD EXACTO (MISCELLANEOUS) ×2 IMPLANT
TRAP ETRAP POLY (MISCELLANEOUS) ×2 IMPLANT
WATER STERILE IRR 250ML POUR (IV SOLUTION) ×3 IMPLANT

## 2021-02-01 NOTE — Op Note (Signed)
[facility] Gastroenterology Patient Name: George [name] Procedure Date: 02/01/2021 8:55 AM MRN: [id number] Account #: [id number] Date of Birth: [birth year] Admit Type: Outpatient Age: 71 Room: [facility] OR ROOM 01 Gender: Male Note Status: Finalized Instrument Name: [id number] Procedure:             Colonoscopy Indications:           High risk colon cancer surveillance: Personal history                         of colonic polyps Providers:             Lucilla Lame MD, MD Referring MD:          Ramonita Lab, MD (Referring MD) Medicines:             Propofol per Anesthesia Complications:         No immediate complications. Procedure:             Pre-Anesthesia Assessment:                        - Prior to the procedure, a History and Physical was                         performed, and patient medications and allergies were                         reviewed. The patient's tolerance of previous                         anesthesia was also reviewed. The risks and benefits                         of the procedure and the sedation options and risks                         were discussed with the patient. All questions were                         answered, and informed consent was obtained. Prior                         Anticoagulants: The patient has taken no previous                         anticoagulant or antiplatelet agents. ASA Grade                         Assessment: II - A patient with mild systemic disease.                         After reviewing the risks and benefits, the patient                         was deemed in satisfactory condition to undergo the                         procedure.  After obtaining informed consent, the colonoscope was                         passed under direct vision. Throughout the procedure,                         the patient's blood pressure, pulse, and oxygen                         saturations were monitored  continuously. The                         Colonoscope was introduced through the anus and                         advanced to the the cecum, identified by appendiceal                         orifice and ileocecal valve. The colonoscopy was                         performed without difficulty. The patient tolerated                         the procedure well. The quality of the bowel                         preparation was excellent. Findings:      The perianal and digital rectal examinations were normal.      Multiple small-mouthed diverticula were found in the entire colon.      A 4 mm polyp was found in the sigmoid colon. The polyp was sessile. The       polyp was removed with a cold snare. Resection and retrieval were       complete.      Non-bleeding internal hemorrhoids were found during retroflexion. The       hemorrhoids were Grade II (internal hemorrhoids that prolapse but reduce       spontaneously). Impression:            - Diverticulosis in the entire examined colon.                        - One 4 mm polyp in the sigmoid colon, removed with a                         cold snare. Resected and retrieved.                        - Non-bleeding internal hemorrhoids. Recommendation:        - Discharge patient to home.                        - Resume previous diet.                        - Continue present medications.                        - Await pathology results.                        -  Repeat colonoscopy is not recommended for                         surveillance. Procedure Code(s):     --- Professional ---                        [phone], Colonoscopy, flexible; with removal of                         tumor(s), polyp(s), or other lesion(s) by snare                         technique Diagnosis Code(s):     --- Professional ---                        Z86.010, Personal history of colonic polyps                        K63.5, Polyp of colon CPT copyright 2019 American Medical  Association. All rights reserved. The codes documented in this report are preliminary and upon coder review may  be revised to meet current compliance requirements. Lucilla Lame MD, MD 02/01/2021 9:32:11 AM This report has been signed electronically. Number of Addenda: 0 Note Initiated On: 02/01/2021 8:55 AM Scope Withdrawal Time: 0 hours 9 minutes 3 seconds  Total Procedure Duration: 0 hours 15 minutes 22 seconds  Estimated Blood Loss:  Estimated blood loss: none.      [facility]

## 2021-02-01 NOTE — H&P (Signed)
° °George Wohl, MD FACG °[address] °[place], [place] [address] °Phone:[phone] °Fax : [phone] ° °Primary Care Physician:  Klein, Bert J III, MD °Primary Gastroenterologist:  Dr. Wohl ° °Pre-Procedure History & Physical: °HPI:  George George is George George y.o. male is here for an colonoscopy. °  °Past Medical History:  °Diagnosis Date  ° Hyperlipemia   ° Hypertension   ° Kidney stones   ° ° °Past Surgical History:  °Procedure Laterality Date  ° COLONOSCOPY WITH PROPOFOL N/George 01/29/2016  ° Procedure: COLONOSCOPY WITH PROPOFOL;  Surgeon: Robert T Elliott, MD;  Location: [facility] ENDOSCOPY;  Service: Endoscopy;  Laterality: N/George;  ° CORONARY ANGIOPLASTY    ° HERNIA REPAIR    ° KIDNEY STONE SURGERY    ° ° °Prior to Admission medications   °Medication Sig Start Date End Date Taking? Authorizing Provider  °aspirin EC 81 MG tablet Take 81 mg by mouth daily.   Yes [provider]  °cetirizine (ZYRTEC) 10 MG tablet Take 10 mg by mouth daily as needed for allergies.   Yes [provider]  °Cyanocobalamin (VITAMIN B 12 PO) Take 1 tablet by mouth daily.   Yes [provider]  °ezetimibe (ZETIA) 10 MG tablet Take 10 mg by mouth daily. 11/25/19  Yes [provider]  °lisinopril (ZESTRIL) 20 MG tablet Take 20 mg by mouth daily.   Yes [provider]  °omeprazole (PRILOSEC) 40 MG capsule  01/19/18  Yes [provider]  °rosuvastatin (CRESTOR) 40 MG tablet  12/08/17  Yes [provider]  °tadalafil (ADCIRCA/CIALIS) 20 MG tablet  09/21/17  Yes [provider]  °tamsulosin (FLOMAX) 0.4 MG CAPS capsule Take 1 capsule (0.4 mg total) by mouth daily. 01/10/16  Yes McShane, George A, MD  °Na Sulfate-K Sulfate-Mg Sulf (SUPREP BOWEL PREP KIT) 17.5-3.13-1.6 GM/177ML SOLN Take 1 kit by mouth as directed. 01/15/21   George, Darren, MD  °quinapril (ACCUPRIL) 20 MG tablet Take 20 mg by mouth daily. °Patient not taking: Reported on 01/13/2021    [provider]   ° ° °Allergies as of 12/01/2020 - Review Complete 02/17/2020  °Allergen Reaction Noted  ° Contrast media [iodinated contrast media]  01/10/2016  ° Penicillins  01/10/2016  ° ° °History reviewed. No pertinent family history. ° °Social History  ° °Socioeconomic History  ° Marital status: Married  °  Spouse name: Not on file  ° Number of children: Not on file  ° Years of education: Not on file  ° Highest education level: Not on file  °Occupational History  ° Not on file  °Tobacco Use  ° Smoking status: Never  ° Smokeless tobacco: Never  °Vaping Use  ° Vaping Use: Never used  °Substance and Sexual Activity  ° Alcohol use: Yes  ° Drug use: No  ° Sexual activity: Yes  °  Birth control/protection: None  °Other Topics Concern  ° Not on file  °Social History Narrative  ° Not on file  ° °Social Determinants of Health  ° °Financial Resource Strain: Not on file  °Food Insecurity: Not on file  °Transportation Needs: Not on file  °Physical Activity: Not on file  °Stress: Not on file  °Social Connections: Not on file  °Intimate Partner Violence: Not on file  ° ° °Review of Systems: °See HPI, otherwise negative ROS ° °Physical Exam: °Ht 5' 10.5" (1.791 m)    Wt 86.2 kg    BMI 26.88 kg/m²  °General:   Alert,  pleasant and   and cooperative in NAD Head:  Normocephalic and atraumatic. Neck:  Supple; no masses or thyromegaly. Lungs:  Clear throughout to auscultation.    Heart:  Regular rate and rhythm. Abdomen:  Soft, nontender and nondistended. Normal bowel sounds, without guarding, and without rebound.   Neurologic:  Alert and  oriented x4;  grossly normal neurologically.  Impression/Plan: An George George is here for an colonoscopy to be performed for George history of adenomatous polyps on 2018   Risks, benefits, limitations, and alternatives regarding  colonoscopy have been reviewed with the patient.  Questions have been answered.  All parties agreeable.   Lucilla Lame, MD  02/01/2021, 8:33 AM

## 2021-02-01 NOTE — Anesthesia Preprocedure Evaluation (Addendum)
Anesthesia Evaluation  Patient identified by MRN, date of birth, ID band Patient awake    Reviewed: Allergy & Precautions, H&P , NPO status , Patient's Chart, lab work & pertinent test results, reviewed documented beta blocker date and time   Airway Mallampati: II  TM Distance: >3 FB Neck ROM: full    Dental no notable dental hx.    Pulmonary neg pulmonary ROS,    Pulmonary exam normal breath sounds clear to auscultation       Cardiovascular Exercise Tolerance: Good hypertension, + CAD  Normal cardiovascular exam Rhythm:regular Rate:Normal     Neuro/Psych negative neurological ROS  negative psych ROS   GI/Hepatic Neg liver ROS, GERD  Controlled,  Endo/Other  negative endocrine ROS  Renal/GU negative Renal ROS  negative genitourinary   Musculoskeletal   Abdominal   Peds  Hematology negative hematology ROS (+)   Anesthesia Other Findings   Reproductive/Obstetrics negative OB ROS                             Anesthesia Physical Anesthesia Plan  ASA: 2  Anesthesia Plan: General   Post-op Pain Management:    Induction:   PONV Risk Score and Plan:   Airway Management Planned:   Additional Equipment:   Intra-op Plan:   Post-operative Plan:   Informed Consent: I have reviewed the patients History and Physical, chart, labs and discussed the procedure including the risks, benefits and alternatives for the proposed anesthesia with the patient or authorized representative who has indicated his/her understanding and acceptance.     Dental Advisory Given  Plan Discussed with: CRNA and Anesthesiologist  Anesthesia Plan Comments:        Anesthesia Quick Evaluation

## 2021-02-01 NOTE — Anesthesia Postprocedure Evaluation (Signed)
Anesthesia Post Note  Patient: George [name]  Procedure(s) Performed: COLONOSCOPY WITH BIOPSY (Rectum) POLYPECTOMY (Rectum)     Patient location during evaluation: PACU Anesthesia Type: General Level of consciousness: awake and alert Pain management: pain level controlled Vital Signs Assessment: post-procedure vital signs reviewed and stable Respiratory status: spontaneous breathing, nonlabored ventilation, respiratory function stable and patient connected to nasal cannula oxygen Cardiovascular status: blood pressure returned to baseline and stable Postop Assessment: no apparent nausea or vomiting Anesthetic complications: no   No notable events documented.  Trecia Rogers

## 2021-02-01 NOTE — Anesthesia Procedure Notes (Signed)
Date/Time: 02/01/2021 9:11 AM Performed by: Cameron Ali, CRNA Pre-anesthesia Checklist: Patient identified, Emergency Drugs available, Suction available, Timeout performed and Patient being monitored Patient Re-evaluated:Patient Re-evaluated prior to induction Oxygen Delivery Method: Nasal cannula Placement Confirmation: positive ETCO2

## 2021-02-01 NOTE — Transfer of Care (Signed)
Immediate Anesthesia Transfer of Care Note  Patient: George [name]  Procedure(s) Performed: COLONOSCOPY WITH BIOPSY (Rectum) POLYPECTOMY (Rectum)  Patient Location: PACU  Anesthesia Type: General  Level of Consciousness: awake, alert  and patient cooperative  Airway and Oxygen Therapy: Patient Spontanous Breathing and Patient connected to supplemental oxygen  Post-op Assessment: Post-op Vital signs reviewed, Patient's Cardiovascular Status Stable, Respiratory Function Stable, Patent Airway and No signs of Nausea or vomiting  Post-op Vital Signs: Reviewed and stable  Complications: No notable events documented.

## 2021-02-02 ENCOUNTER — Encounter: Payer: Self-pay | Admitting: Gastroenterology

## 2021-02-02 LAB — SURGICAL PATHOLOGY

## 2021-02-15 ENCOUNTER — Ambulatory Visit
Admission: RE | Admit: 2021-02-15 | Discharge: 2021-02-15 | Disposition: A | Discharge status: Home / Self Care | Payer: Medicare HMO | Source: Ambulatory Visit | Attending: Urology | Admitting: Urology

## 2021-02-15 ENCOUNTER — Other Ambulatory Visit: Payer: Self-pay

## 2021-02-15 ENCOUNTER — Encounter: Payer: Self-pay | Admitting: Urology

## 2021-02-15 ENCOUNTER — Ambulatory Visit: Payer: Medicare HMO | Admitting: Urology

## 2021-02-15 ENCOUNTER — Ambulatory Visit
Admission: RE | Admit: 2021-02-15 | Discharge: 2021-02-15 | Disposition: A | Discharge status: Home / Self Care | Payer: Medicare HMO | Attending: Urology | Admitting: Urology

## 2021-02-15 VITALS — BP 133/79 | HR 66 | Ht 70.5 in | Wt 190.0 lb

## 2021-02-15 DIAGNOSIS — N200 Calculus of kidney: Secondary | ICD-10-CM

## 2021-02-15 NOTE — Progress Notes (Signed)
02/15/2021 2:06 PM   George [name] 1950/07/02 [id number]  Referring provider: Adin Hector, MD [facility] [place],  [place] [address]  Chief Complaint  Patient presents with   Nephrolithiasis    Urologic history: 1.  Recurrent stone disease             -Bilateral nonobstructing nephrolithiasis  HPI: 71 y.o. male presents for annual follow-up.  Doing well since last visit No bothersome LUTS Denies dysuria, gross hematuria Denies flank, abdominal or pelvic pain   PMH: Past Medical History:  Diagnosis Date   Hyperlipemia    Hypertension    Kidney stones     Surgical History: Past Surgical History:  Procedure Laterality Date   COLONOSCOPY N/A 02/01/2021   Procedure: COLONOSCOPY WITH BIOPSY;  Surgeon: Lucilla Lame, MD;  Location: [place];  Service: Endoscopy;  Laterality: N/A;   COLONOSCOPY WITH PROPOFOL N/A 01/29/2016   Procedure: COLONOSCOPY WITH PROPOFOL;  Surgeon: Manya Silvas, MD;  Location: [facility] ENDOSCOPY;  Service: Endoscopy;  Laterality: N/A;   CORONARY ANGIOPLASTY     HERNIA REPAIR     KIDNEY STONE SURGERY     POLYPECTOMY N/A 02/01/2021   Procedure: POLYPECTOMY;  Surgeon: Lucilla Lame, MD;  Location: [place];  Service: Endoscopy;  Laterality: N/A;    Home Medications:  Allergies as of 02/15/2021       Reactions   Contrast Media [iodinated Contrast Media]    Penicillins         Medication List        Accurate as of February 15, 2021  2:06 PM. If you have any questions, ask your nurse or doctor.          STOP taking these medications    quinapril 20 MG tablet Commonly known as: ACCUPRIL Stopped by: Abbie Sons, MD   tamsulosin 0.4 MG Caps capsule Commonly known as: Flomax Stopped by: Abbie Sons, MD       TAKE these medications    aspirin EC 81 MG tablet Take 81 mg by mouth daily.   cetirizine 10 MG tablet Commonly known as: ZYRTEC Take 10 mg by mouth daily as  needed for allergies.   ezetimibe 10 MG tablet Commonly known as: ZETIA Take 10 mg by mouth daily.   lisinopril 20 MG tablet Commonly known as: ZESTRIL Take 20 mg by mouth daily.   Na Sulfate-K Sulfate-Mg Sulf 17.5-3.13-1.6 GM/177ML Soln Commonly known as: Suprep Bowel Prep Kit Take 1 kit by mouth as directed.   omeprazole 40 MG capsule Commonly known as: PRILOSEC   rosuvastatin 40 MG tablet Commonly known as: CRESTOR   tadalafil 20 MG tablet Commonly known as: CIALIS   VITAMIN B 12 PO Take 1 tablet by mouth daily.        Allergies:  Allergies  Allergen Reactions   Contrast Media [Iodinated Contrast Media]    Penicillins     Family History: History reviewed. No pertinent family history.  Social History:  reports that he has never smoked. He has never used smokeless tobacco. He reports current alcohol use. He reports that he does not use drugs.   Physical Exam: BP 133/79    Pulse 66    Ht 5' 10.5" (1.791 m)    Wt 190 lb (86.2 kg)    BMI 26.88 kg/m   Constitutional:  Alert and oriented, No acute distress. HEENT: [place] AT, moist mucus membranes.  Trachea midline, no masses. Cardiovascular: No  clubbing, cyanosis, or edema. Respiratory: Normal respiratory effort, no increased work of breathing. Psychiatric: Normal mood and affect.   Pertinent Imaging: Images from a KUB performed earlier today were reviewed and they are stable, bilateral renal calculi when compared with last year's KUB  Assessment & Plan:    1.  Bilateral nephrolithiasis Stable Asymptomatic He desires to continue surveillance and will follow up annually.  Call earlier for recurrent renal colic   Abbie Sons, MD  [facility] [address] [place], [place] [address] [phone]

## 2021-08-24 ENCOUNTER — Ambulatory Visit
Admission: RE | Admit: 2021-08-24 | Discharge: 2021-08-24 | Disposition: A | Discharge status: Home / Self Care | Payer: Medicare HMO | Attending: Urology | Admitting: Urology

## 2021-08-24 ENCOUNTER — Ambulatory Visit
Admission: RE | Admit: 2021-08-24 | Discharge: 2021-08-24 | Disposition: A | Discharge status: Home / Self Care | Payer: Medicare HMO | Source: Ambulatory Visit | Attending: Urology | Admitting: Urology

## 2021-08-24 ENCOUNTER — Other Ambulatory Visit: Payer: Self-pay | Admitting: Family Medicine

## 2021-08-24 ENCOUNTER — Encounter: Payer: Self-pay | Admitting: Urology

## 2021-08-24 DIAGNOSIS — N200 Calculus of kidney: Secondary | ICD-10-CM

## 2021-08-24 NOTE — Telephone Encounter (Signed)
Spoke to patient and scheduled appointment. KUB ordered

## 2021-08-25 ENCOUNTER — Encounter: Payer: Self-pay | Admitting: Urology

## 2021-08-25 ENCOUNTER — Ambulatory Visit: Payer: Medicare HMO | Admitting: Urology

## 2021-08-25 VITALS — BP 145/86 | HR 67 | Ht 70.5 in | Wt 190.0 lb

## 2021-08-25 DIAGNOSIS — N200 Calculus of kidney: Secondary | ICD-10-CM | POA: Diagnosis not present

## 2021-08-25 DIAGNOSIS — N202 Calculus of kidney with calculus of ureter: Secondary | ICD-10-CM | POA: Diagnosis not present

## 2021-08-25 DIAGNOSIS — N201 Calculus of ureter: Secondary | ICD-10-CM

## 2021-08-25 LAB — URINALYSIS, COMPLETE
Bilirubin, UA: NEGATIVE
Glucose, UA: NEGATIVE
Ketones, UA: NEGATIVE
Leukocytes,UA: NEGATIVE
Nitrite, UA: NEGATIVE
Protein,UA: NEGATIVE
Specific Gravity, UA: 1.015 (ref 1.005–1.030)
Urobilinogen, Ur: 0.2 mg/dL (ref 0.2–1.0)
pH, UA: 5.5 (ref 5.0–7.5)

## 2021-08-25 LAB — MICROSCOPIC EXAMINATION

## 2021-08-25 MED ORDER — TAMSULOSIN HCL 0.4 MG PO CAPS: 0.4000 mg | ORAL_CAPSULE | Freq: Every day | ORAL | 0 refills | Status: AC

## 2021-08-25 NOTE — Progress Notes (Signed)
08/25/2021 8:40 AM   George [name] 1950/08/18 [id number]  Referring provider: Adin Hector, MD [facility] [address] [place],  [place] [address]  Chief Complaint  Patient presents with   Nephrolithiasis    Urologic history: 1.  Recurrent stone disease             -Bilateral nonobstructing nephrolithiasis  HPI: 71 y.o. male called for an acute visit for possible ureteral calculus  Onset left flank pain radiating to left hip region 08/23/2021 No fever, chills, nausea or vomiting Had oxycodone from a prior Rx and took that evening with significant improvement Current pain mild KUB done prior to office visit   PMH: Past Medical History:  Diagnosis Date   Hyperlipemia    Hypertension    Kidney stones     Surgical History: Past Surgical History:  Procedure Laterality Date   COLONOSCOPY N/A 02/01/2021   Procedure: COLONOSCOPY WITH BIOPSY;  Surgeon: Lucilla Lame, MD;  Location: [place];  Service: Endoscopy;  Laterality: N/A;   COLONOSCOPY WITH PROPOFOL N/A 01/29/2016   Procedure: COLONOSCOPY WITH PROPOFOL;  Surgeon: Manya Silvas, MD;  Location: [facility] ENDOSCOPY;  Service: Endoscopy;  Laterality: N/A;   CORONARY ANGIOPLASTY     HERNIA REPAIR     KIDNEY STONE SURGERY     POLYPECTOMY N/A 02/01/2021   Procedure: POLYPECTOMY;  Surgeon: Lucilla Lame, MD;  Location: [place];  Service: Endoscopy;  Laterality: N/A;    Home Medications:  Allergies as of 08/25/2021       Reactions   Contrast Media [iodinated Contrast Media]    Penicillins         Medication List        Accurate as of August 25, 2021  8:40 AM. If you have any questions, ask your nurse or doctor.          aspirin EC 81 MG tablet Take 81 mg by mouth daily.   cetirizine 10 MG tablet Commonly known as: ZYRTEC Take 10 mg by mouth daily as needed for allergies.   ezetimibe 10 MG tablet Commonly known as: ZETIA Take 10 mg by mouth daily.   lisinopril  20 MG tablet Commonly known as: ZESTRIL Take 20 mg by mouth daily.   Na Sulfate-K Sulfate-Mg Sulf 17.5-3.13-1.6 GM/177ML Soln Commonly known as: Suprep Bowel Prep Kit Take 1 kit by mouth as directed.   omeprazole 40 MG capsule Commonly known as: PRILOSEC   rosuvastatin 40 MG tablet Commonly known as: CRESTOR   tadalafil 20 MG tablet Commonly known as: CIALIS   VITAMIN B 12 PO Take 1 tablet by mouth daily.        Allergies:  Allergies  Allergen Reactions   Contrast Media [Iodinated Contrast Media]    Penicillins     Family History: History reviewed. No pertinent family history.  Social History:  reports that he has never smoked. He has never used smokeless tobacco. He reports current alcohol use. He reports that he does not use drugs.   Physical Exam: BP (!) 145/86   Pulse 67   Ht 5' 10.5" (1.791 m)   Wt 190 lb (86.2 kg)   BMI 26.88 kg/m   Constitutional:  Alert and oriented, No acute distress. HEENT: [place] AT Respiratory: Normal respiratory effort, no increased work of breathing. Psychiatric: Normal mood and affect.   Pertinent Imaging: Images from a KUB performed earlier this a.m. remarkable for a ~ 5 mm calculus just medial to the left  pelvic phleboliths and appears to be a migrated calculus from a midpole calyx based on review of his previous KUB  Assessment & Plan:    1.  Left ureteral calculus Left distal ureteral calculus on KUB Management options were discussed including trial of passage, ureteroscopy/laser lithotripsy and shockwave lithotripsy He is minimally symptomatic at present and has initially elected a trial of passage. Rx tamsulosin sent to pharmacy If he has not passed the stone over the weekend he will call back Monday and if he desires treatment will verify calculus by stone protocol CT  2.  Bilateral nephrolithiasis    Abbie Sons, MD  [facility] [address], [place], [place]  [address] [phone]

## 2021-09-15 ENCOUNTER — Telehealth: Payer: Self-pay | Admitting: *Deleted

## 2021-09-15 ENCOUNTER — Other Ambulatory Visit: Payer: Self-pay | Admitting: Urology

## 2021-09-15 DIAGNOSIS — N201 Calculus of ureter: Secondary | ICD-10-CM

## 2021-09-15 NOTE — Telephone Encounter (Signed)
Patient called in today and states she would like the stone removed . (Ureteroscopy )

## 2021-09-15 NOTE — Progress Notes (Signed)
Surgical Physician Order Form [facility]  * Scheduling expectation : Next Available  *Length of Case: 30 min  *Clearance needed: no  *Anticoagulation Instructions: N/A  *Aspirin Instructions: Ok to continue Aspirin  *Post-op visit Date/Instructions:  1 month follow up  *Diagnosis: Left Ureteral Stone  *Procedure:  Left  Ureteroscopy w/laser lithotripsy & stent placement (40981)   Additional orders: N/A  -Admit type: OUTpatient  -Anesthesia: Choice  -VTE Prophylaxis Standing Order SCD's       Other:   -Standing Lab Orders Per Anesthesia    Lab other: UA&Urine Culture  -Standing Test orders EKG/Chest x-ray per Anesthesia       Test other:   - Medications:  Ancef 2gm IV  -Other orders:  Ok to proceed with Ancef PCN allergy reviewed

## 2021-09-17 ENCOUNTER — Telehealth: Payer: Self-pay

## 2021-09-17 NOTE — Progress Notes (Signed)
[facility] Posting Form   Surgery Date/Time: Date: 09/28/2021  Surgeon: Dr. John Giovanni, MD  Surgery Location: Day Surgery  Inpt ( No  )   Outpt (Yes)   Obs ( No  )   Diagnosis: N20.1 Left Ureteral Stone  -CPT: [phone]  Surgery: Left Ureteroscopy with laser lithotripsy and stent placement  Stop Anticoagulations: No, may continue ASA  Cardiac/Medical/Pulmonary Clearance needed: no  *Orders entered into EPIC  Date: 09/17/21   *Case booked in [place]  Date: 09/16/2021  *Notified pt of Surgery: Date: 09/16/2021  PRE-OP UA & CX: yes, will obtain in clinic on 09/21/2021  *Placed into Prior Authorization Work Fabio Bering Date: 09/17/21   Assistant/laser/rep:No

## 2021-09-17 NOTE — Telephone Encounter (Signed)
I spoke with Mr. George [name]. We have discussed possible surgery dates and Tuesday September 19th, 2023 was agreed upon by all parties. Patient given information about surgery date, what to expect pre-operatively and post operatively.  We discussed that a Pre-Admission Testing office will be calling to set up the pre-op visit that will take place prior to surgery, and that these appointments are typically done over the phone with a Pre-Admissions RN.  Informed patient that our office will communicate any additional care to be provided after surgery. Patients questions or concerns were discussed during our call. Advised to call our office should there be any additional information, questions or concerns that arise. Patient verbalized understanding.

## 2021-09-21 ENCOUNTER — Other Ambulatory Visit: Payer: Medicare HMO

## 2021-09-21 DIAGNOSIS — N201 Calculus of ureter: Secondary | ICD-10-CM

## 2021-09-21 LAB — URINALYSIS, COMPLETE
Bilirubin, UA: NEGATIVE
Glucose, UA: NEGATIVE
Ketones, UA: NEGATIVE
Leukocytes,UA: NEGATIVE
Nitrite, UA: NEGATIVE
Protein,UA: NEGATIVE
RBC, UA: NEGATIVE
Specific Gravity, UA: 1.01 (ref 1.005–1.030)
Urobilinogen, Ur: 0.2 mg/dL (ref 0.2–1.0)
pH, UA: 6.5 (ref 5.0–7.5)

## 2021-09-21 LAB — MICROSCOPIC EXAMINATION: Bacteria, UA: NONE SEEN

## 2021-09-23 ENCOUNTER — Encounter
Admission: RE | Admit: 2021-09-23 | Discharge: 2021-09-23 | Disposition: A | Discharge status: Home / Self Care | Payer: Medicare HMO | Source: Ambulatory Visit | Attending: Urology | Admitting: Urology

## 2021-09-23 DIAGNOSIS — I10 Essential (primary) hypertension: Secondary | ICD-10-CM

## 2021-09-23 DIAGNOSIS — Z01818 Encounter for other preprocedural examination: Secondary | ICD-10-CM

## 2021-09-23 HISTORY — DX: Personal history of urinary calculi: Z87.442

## 2021-09-23 HISTORY — DX: Other specified postprocedural states: Z98.890

## 2021-09-23 HISTORY — DX: Atherosclerosis of aorta: I70.0

## 2021-09-23 HISTORY — DX: Malignant (primary) neoplasm, unspecified: C80.1

## 2021-09-23 HISTORY — DX: Unspecified osteoarthritis, unspecified site: M19.90

## 2021-09-23 HISTORY — DX: Presence of coronary angioplasty implant and graft: Z95.5

## 2021-09-23 HISTORY — DX: Gastro-esophageal reflux disease without esophagitis: K21.9

## 2021-09-23 HISTORY — DX: Atherosclerotic heart disease of native coronary artery without angina pectoris: I25.10

## 2021-09-23 NOTE — Patient Instructions (Addendum)
Your procedure is scheduled on:09-28-21 Tuesday Report to the Registration Desk on the 1st floor of the [facility].Then proceed to the 2nd floor Surgery Desk To find out your arrival time, please call [phone] between 1PM - 3PM on: 09-27-21 Monday If your arrival time is 6:00 am, do not arrive prior to that time as the [facility] entrance doors do not open until 6:00 am.  REMEMBER: Instructions that are not followed completely may result in serious medical risk, up to and including death; or upon the discretion of your surgeon and anesthesiologist your surgery may need to be rescheduled.  Do not eat food OR drink any liquids after midnight the night before surgery.  No gum chewing, lozengers or hard candies.  TAKE THESE MEDICATIONS THE MORNING OF SURGERY WITH A SIP OF WATER: -omeprazole (PRILOSEC) -take one the night before and one on the morning of surgery - helps to prevent nausea after surgery.)  You may continue your 81 mg Aspirin as instructed by Dr Bernardo Heater  One week prior to surgery: Stop Anti-inflammatories (NSAIDS) such as Advil, Aleve, Ibuprofen, Motrin, Naproxen, Naprosyn and Aspirin based products such as Excedrin, Goodys Powder, BC Powder.You may however, take Tylenol/Percocet if needed for pain up until the day of surgery.  Stop ANY OVER THE COUNTER supplements/vitamins NOW (09-23-21) until after surgery (Vitamin B12, D and Megnesium)  No Alcohol for 24 hours before or after surgery.  No Smoking including e-cigarettes for 24 hours prior to surgery.  No chewable tobacco products for at least 6 hours prior to surgery.  No nicotine patches on the day of surgery.  Do not use any "recreational" drugs for at least a week prior to your surgery.  Please be advised that the combination of cocaine and anesthesia may have negative outcomes, up to and including death. If you test positive for cocaine, your surgery will be cancelled.  On the morning of surgery brush your teeth  with toothpaste and water, you may rinse your mouth with mouthwash if you wish. Do not swallow any toothpaste or mouthwash.  Do not wear jewelry, make-up, hairpins, clips or nail polish.  Do not wear lotions, powders, or perfumes.   Do not shave body from the neck down 48 hours prior to surgery just in case you cut yourself which could leave a site for infection.  Also, freshly shaved skin may become irritated if using the CHG soap.  Contact lenses, hearing aids and dentures may not be worn into surgery.  Do not bring valuables to the hospital. [facility] is not responsible for any missing/lost belongings or valuables.   Notify your doctor if there is any change in your medical condition (cold, fever, infection).  Wear comfortable clothing (specific to your surgery type) to the hospital.  After surgery, you can help prevent lung complications by doing breathing exercises.  Take deep breaths and cough every 1-2 hours. Your doctor may order a device called an Incentive Spirometer to help you take deep breaths. When coughing or sneezing, hold a pillow firmly against your incision with both hands. This is called "splinting." Doing this helps protect your incision. It also decreases belly discomfort.  If you are being admitted to the hospital overnight, leave your suitcase in the car. After surgery it may be brought to your room.  If you are being discharged the day of surgery, you will not be allowed to drive home. You will need a responsible adult (18 years or older) to drive you home and  stay with you that night.   If you are taking public transportation, you will need to have a responsible adult (18 years or older) with you. Please confirm with your physician that it is acceptable to use public transportation.   Please call the [facility] Dept. at [phone] if you have any questions about these instructions.  Surgery Visitation Policy:  Patients undergoing a  surgery or procedure may have two family members or support persons with them as long as the person is not COVID-19 positive or experiencing its symptoms.

## 2021-09-24 LAB — CULTURE, URINE COMPREHENSIVE

## 2021-09-27 ENCOUNTER — Encounter
Admission: RE | Admit: 2021-09-27 | Discharge: 2021-09-27 | Disposition: A | Discharge status: Home / Self Care | Payer: Medicare HMO | Source: Ambulatory Visit | Attending: Urology | Admitting: Urology

## 2021-09-27 DIAGNOSIS — Z01818 Encounter for other preprocedural examination: Secondary | ICD-10-CM

## 2021-09-27 DIAGNOSIS — I10 Essential (primary) hypertension: Secondary | ICD-10-CM | POA: Diagnosis not present

## 2021-09-27 DIAGNOSIS — Z01810 Encounter for preprocedural cardiovascular examination: Secondary | ICD-10-CM | POA: Diagnosis not present

## 2021-09-28 ENCOUNTER — Ambulatory Visit: Payer: Medicare HMO | Admitting: Anesthesiology

## 2021-09-28 ENCOUNTER — Ambulatory Visit
Admission: RE | Admit: 2021-09-28 | Discharge: 2021-09-28 | Disposition: A | Discharge status: Home / Self Care | Payer: Medicare HMO | Attending: Urology | Admitting: Urology

## 2021-09-28 ENCOUNTER — Other Ambulatory Visit: Payer: Self-pay

## 2021-09-28 ENCOUNTER — Encounter
Admission: RE | Disposition: A | Discharge status: Home / Self Care | Payer: Self-pay | Source: Home / Self Care | Attending: Urology

## 2021-09-28 ENCOUNTER — Ambulatory Visit: Payer: Medicare HMO

## 2021-09-28 ENCOUNTER — Encounter: Payer: Self-pay | Admitting: Urology

## 2021-09-28 DIAGNOSIS — N210 Calculus in bladder: Secondary | ICD-10-CM | POA: Diagnosis not present

## 2021-09-28 DIAGNOSIS — I10 Essential (primary) hypertension: Secondary | ICD-10-CM | POA: Insufficient documentation

## 2021-09-28 DIAGNOSIS — N201 Calculus of ureter: Secondary | ICD-10-CM | POA: Diagnosis not present

## 2021-09-28 DIAGNOSIS — Z955 Presence of coronary angioplasty implant and graft: Secondary | ICD-10-CM | POA: Insufficient documentation

## 2021-09-28 DIAGNOSIS — I2510 Atherosclerotic heart disease of native coronary artery without angina pectoris: Secondary | ICD-10-CM | POA: Diagnosis not present

## 2021-09-28 DIAGNOSIS — K219 Gastro-esophageal reflux disease without esophagitis: Secondary | ICD-10-CM | POA: Diagnosis not present

## 2021-09-28 DIAGNOSIS — N202 Calculus of kidney with calculus of ureter: Secondary | ICD-10-CM | POA: Diagnosis present

## 2021-09-28 HISTORY — PX: CYSTOSCOPY/URETEROSCOPY/HOLMIUM LASER/STENT PLACEMENT: SHX6546

## 2021-09-28 SURGERY — CYSTOSCOPY/URETEROSCOPY/HOLMIUM LASER/STENT PLACEMENT
Anesthesia: General | Laterality: Left

## 2021-09-28 MED ORDER — OXYCODONE HCL 5 MG PO TABS: 5.0000 mg | ORAL_TABLET | Freq: Once | ORAL | Status: DC | PRN

## 2021-09-28 MED ORDER — PROPOFOL 10 MG/ML IV BOLUS
INTRAVENOUS | Status: DC | PRN
  Administered 2021-09-28: 140 mg via INTRAVENOUS
  Administered 2021-09-28: 30 mg via INTRAVENOUS

## 2021-09-28 MED ORDER — FENTANYL CITRATE (PF) 100 MCG/2ML IJ SOLN
INTRAMUSCULAR | Status: AC
  Filled 2021-09-28: qty 2

## 2021-09-28 MED ORDER — ONDANSETRON HCL 4 MG/2ML IJ SOLN
INTRAMUSCULAR | Status: DC | PRN
  Administered 2021-09-28: 4 mg via INTRAVENOUS

## 2021-09-28 MED ORDER — IOHEXOL 180 MG/ML  SOLN
INTRAMUSCULAR | Status: DC | PRN
  Administered 2021-09-28: 10 mL

## 2021-09-28 MED ORDER — FENTANYL CITRATE (PF) 100 MCG/2ML IJ SOLN: 25.0000 ug | INTRAMUSCULAR | Status: DC | PRN

## 2021-09-28 MED ORDER — FENTANYL CITRATE (PF) 100 MCG/2ML IJ SOLN
INTRAMUSCULAR | Status: DC | PRN
  Administered 2021-09-28 (×2): 50 ug via INTRAVENOUS

## 2021-09-28 MED ORDER — LIDOCAINE HCL (CARDIAC) PF 100 MG/5ML IV SOSY
PREFILLED_SYRINGE | INTRAVENOUS | Status: DC | PRN
  Administered 2021-09-28: 100 mg via INTRAVENOUS

## 2021-09-28 MED ORDER — ACETAMINOPHEN 10 MG/ML IV SOLN
INTRAVENOUS | Status: AC
  Filled 2021-09-28: qty 100

## 2021-09-28 MED ORDER — ACETAMINOPHEN 10 MG/ML IV SOLN: 1000.0000 mg | Freq: Once | INTRAVENOUS | Status: DC | PRN

## 2021-09-28 MED ORDER — DEXAMETHASONE SODIUM PHOSPHATE 10 MG/ML IJ SOLN
INTRAMUSCULAR | Status: DC | PRN
  Administered 2021-09-28: 8 mg via INTRAVENOUS

## 2021-09-28 MED ORDER — CEFAZOLIN SODIUM-DEXTROSE 2-4 GM/100ML-% IV SOLN
2.0000 g | INTRAVENOUS | Status: AC
  Administered 2021-09-28: 2 g via INTRAVENOUS

## 2021-09-28 MED ORDER — LACTATED RINGERS IV SOLN: INTRAVENOUS | Status: DC

## 2021-09-28 MED ORDER — KETOROLAC TROMETHAMINE 30 MG/ML IJ SOLN
INTRAMUSCULAR | Status: DC | PRN
  Administered 2021-09-28: 15 mg via INTRAVENOUS

## 2021-09-28 MED ORDER — ONDANSETRON HCL 4 MG/2ML IJ SOLN: 4.0000 mg | Freq: Once | INTRAMUSCULAR | Status: DC | PRN

## 2021-09-28 MED ORDER — OXYCODONE HCL 5 MG/5ML PO SOLN: 5.0000 mg | Freq: Once | ORAL | Status: DC | PRN

## 2021-09-28 MED ORDER — ACETAMINOPHEN 10 MG/ML IV SOLN
INTRAVENOUS | Status: DC | PRN
  Administered 2021-09-28: 1000 mg via INTRAVENOUS

## 2021-09-28 MED ORDER — CEFAZOLIN SODIUM-DEXTROSE 2-4 GM/100ML-% IV SOLN
INTRAVENOUS | Status: AC
  Filled 2021-09-28: qty 100

## 2021-09-28 MED ORDER — PROPOFOL 10 MG/ML IV BOLUS
INTRAVENOUS | Status: AC
  Filled 2021-09-28: qty 40

## 2021-09-28 MED ORDER — ORAL CARE MOUTH RINSE: 15.0000 mL | Freq: Once | OROMUCOSAL | Status: AC

## 2021-09-28 MED ORDER — CHLORHEXIDINE GLUCONATE 0.12 % MT SOLN: 15.0000 mL | Freq: Once | OROMUCOSAL | Status: AC

## 2021-09-28 MED ORDER — SODIUM CHLORIDE 0.9 % IR SOLN
Status: DC | PRN
  Administered 2021-09-28: 500 mL via INTRAVESICAL

## 2021-09-28 MED ORDER — CHLORHEXIDINE GLUCONATE 0.12 % MT SOLN
OROMUCOSAL | Status: AC
  Administered 2021-09-28: 15 mL via OROMUCOSAL
  Filled 2021-09-28: qty 15

## 2021-09-28 MED ORDER — TROSPIUM CHLORIDE 20 MG PO TABS: 20.0000 mg | ORAL_TABLET | Freq: Two times a day (BID) | ORAL | 0 refills | Status: AC | PRN

## 2021-09-28 SURGICAL SUPPLY — 30 items
BAG DRAIN SIEMENS DORNER NS (MISCELLANEOUS) ×1 IMPLANT
BAG DRN NS LF (MISCELLANEOUS) ×1
BASKET ZERO TIP 1.9FR (BASKET) IMPLANT
BRUSH SCRUB EZ 1% IODOPHOR (MISCELLANEOUS) ×1 IMPLANT
BSKT STON RTRVL ZERO TP 1.9FR (BASKET)
CATH URET FLEX-TIP 2 LUMEN 10F (CATHETERS) IMPLANT
CATH URETL OPEN END 6X70 (CATHETERS) IMPLANT
CNTNR SPEC 2.5X3XGRAD LEK (MISCELLANEOUS)
CONT SPEC 4OZ STER OR WHT (MISCELLANEOUS)
CONT SPEC 4OZ STRL OR WHT (MISCELLANEOUS)
CONTAINER SPEC 2.5X3XGRAD LEK (MISCELLANEOUS) IMPLANT
DRAPE UTILITY 15X26 TOWEL STRL (DRAPES) ×1 IMPLANT
FIBER LASER MOSES 200 DFL (Laser) IMPLANT
GLOVE SURG UNDER POLY LF SZ7.5 (GLOVE) ×1 IMPLANT
GOWN STRL REUS W/ TWL LRG LVL3 (GOWN DISPOSABLE) ×1 IMPLANT
GOWN STRL REUS W/ TWL XL LVL3 (GOWN DISPOSABLE) ×1 IMPLANT
GOWN STRL REUS W/TWL LRG LVL3 (GOWN DISPOSABLE) ×1
GOWN STRL REUS W/TWL XL LVL3 (GOWN DISPOSABLE) ×1
GUIDEWIRE STR DUAL SENSOR (WIRE) ×1 IMPLANT
IV NS IRRIG 3000ML ARTHROMATIC (IV SOLUTION) ×1 IMPLANT
KIT TURNOVER CYSTO (KITS) ×1 IMPLANT
PACK CYSTO AR (MISCELLANEOUS) ×1 IMPLANT
SET CYSTO W/LG BORE CLAMP LF (SET/KITS/TRAYS/PACK) ×1 IMPLANT
SHEATH NAVIGATOR HD 12/14X36 (SHEATH) IMPLANT
STENT URET 6FRX24 CONTOUR (STENTS) IMPLANT
STENT URET 6FRX26 CONTOUR (STENTS) IMPLANT
SURGILUBE 2OZ TUBE FLIPTOP (MISCELLANEOUS) ×1 IMPLANT
TRAP FLUID SMOKE EVACUATOR (MISCELLANEOUS) ×1 IMPLANT
VALVE UROSEAL ADJ ENDO (VALVE) IMPLANT
WATER STERILE IRR 500ML POUR (IV SOLUTION) ×1 IMPLANT

## 2021-09-28 NOTE — Anesthesia Procedure Notes (Signed)
Procedure Name: LMA Insertion Date/Time: 09/28/2021 11:32 AM  Performed by: Lowry Bowl, CRNAPre-anesthesia Checklist: Patient being monitored, Suction available, Emergency Drugs available and Patient identified Patient Re-evaluated:Patient Re-evaluated prior to induction Oxygen Delivery Method: Circle system utilized Preoxygenation: Pre-oxygenation with 100% oxygen Induction Type: IV induction LMA: LMA inserted LMA Size: 4.0 Number of attempts: 1 Placement Confirmation: positive ETCO2 and breath sounds checked- equal and bilateral Tube secured with: Tape Dental Injury: Teeth and Oropharynx as per pre-operative assessment

## 2021-09-28 NOTE — H&P (Signed)
Urology H&P   Chief Complaint: Kidney stone  History of Present Illness: George [name] is a 71 y.o. male with a history of recurrent stone disease.  1-33-monthhistory of intermittent left flank pain.  KUB showed a calcification in the left true bony pelvis consistent with a distal ureteral calculus.  He continues with intermittent symptoms and has requested ureteroscopic stone removal.  Past Medical History:  Diagnosis Date   Aortic atherosclerosis ([facility]    Arthritis    Cancer ([facility]    skin cancer   Coronary artery disease    GERD (gastroesophageal reflux disease)    H/O heart artery stent    History of kidney stones    Hyperlipemia    Hypertension    PONV (postoperative nausea and vomiting)     Past Surgical History:  Procedure Laterality Date   COLONOSCOPY N/A 02/01/2021   Procedure: COLONOSCOPY WITH BIOPSY;  Surgeon: WLucilla Lame MD;  [place] [facility]  Service: Endoscopy;  Laterality: N/A;   COLONOSCOPY WITH PROPOFOL N/A 01/29/2016   Procedure: COLONOSCOPY WITH PROPOFOL;  Surgeon: RManya Silvas MD;  [facility]  Service: Endoscopy;  Laterality: N/A;   CORONARY ANGIOPLASTY     HERNIA REPAIR     KIDNEY STONE SURGERY     x2   POLYPECTOMY N/A 02/01/2021   Procedure: POLYPECTOMY;  Surgeon: WLucilla Lame MD;  [place] [facility]  Service: Endoscopy;  Laterality: N/A;    Home Medications:  Current Meds  Medication Sig   latanoprost (XALATAN) 0.005 % ophthalmic solution Place 1 drop into the right eye at bedtime.   lisinopril (ZESTRIL) 20 MG tablet Take 20 mg by mouth every morning.   omeprazole (PRILOSEC) 40 MG capsule 40 mg as needed.   oxyCODONE-acetaminophen (PERCOCET) 5-325 MG tablet Take 1 tablet by mouth every 4 (four) hours as needed for severe pain.    Allergies:  Allergies  Allergen Reactions   Contrast Media [Iodinated Contrast Media] Hives   Penicillins     Unsure last had as a child    History reviewed. No  pertinent family history.  Social History:  reports that he has never smoked. He has never used smokeless tobacco. He reports current alcohol use. He reports that he does not use drugs.  ROS: A complete review of systems was performed.  All systems are negative except for pertinent findings as noted.  Physical Exam:  Vital signs in last 24 hours: Temp:  [98.1 F (36.7 C)] 98.1 F (36.7 C) (09/19 1041) Pulse Rate:  [55] 55 (09/19 1041) Resp:  [16] 16 (09/19 1041) BP: (135)/(91) 135/91 (09/19 1041) SpO2:  [99 %] 99 % (09/19 1041) Weight:  [87.1 kg] 87.1 kg (09/19 1041) Constitutional:  Alert and oriented, No acute distress HEENT: [place] AT Cardiovascular: Regular rate and rhythm Respiratory: Normal respiratory effort, lungs clear bilaterally Psychiatric: Normal mood and affect   Laboratory Data:  No results for input(s): "WBC", "HGB", "HCT" in the last 72 hours. No results for input(s): "NA", "K", "CL", "CO2", "GLUCOSE", "BUN", "CREATININE", "CALCIUM" in the last 72 hours. No results for input(s): "LABPT", "INR" in the last 72 hours. No results for input(s): "LABURIN" in the last 72 hours. Results for orders placed or performed in visit on 09/21/21  CULTURE, URINE COMPREHENSIVE     Status: None   Collection Time: 09/21/21  8:28 AM   Specimen: Urine   UR  Result Value Ref Range Status   Urine Culture, Comprehensive Final report  Final  Organism ID, Bacteria Comment  Final    Comment: No growth in 36 - 48 hours.  Microscopic Examination     Status: None   Collection Time: 09/21/21  8:28 AM   Urine  Result Value Ref Range Status   WBC, UA 0-5 0 - 5 /hpf Final   RBC, Urine 0-2 0 - 2 /hpf Final   Epithelial Cells (non renal) 0-10 0 - 10 /hpf Final   Bacteria, UA None seen None seen/Few Final     Impression/Assessment:  71 y.o. male with a left distal ureteral calculus and intermittent symptoms.  He has requested ureteroscopic removal.  The procedure has been discussed in  detail including potential risks of bleeding, infection and ureteral injury.  All questions were answered and he desires to proceed.   09/28/2021, 11:12 AM  John Giovanni,  MD

## 2021-09-28 NOTE — Anesthesia Preprocedure Evaluation (Addendum)
Anesthesia Evaluation  Patient identified by MRN, date of birth, ID band Patient awake    Reviewed: Allergy & Precautions, NPO status , Patient's Chart, lab work & pertinent test results  History of Anesthesia Complications (+) PONV and history of anesthetic complications  Airway Mallampati: III   Neck ROM: Full    Dental no notable dental hx.    Pulmonary neg pulmonary ROS,    Pulmonary exam normal breath sounds clear to auscultation       Cardiovascular hypertension, + CAD (s/p stent)  Normal cardiovascular exam Rhythm:Regular Rate:Normal  ECG 09/27/21:  Sinus bradycardia Otherwise normal ECG When compared with ECG of 16-Dec-2002 12:37, No significant change was found   Neuro/Psych negative neurological ROS     GI/Hepatic GERD  ,  Endo/Other  negative endocrine ROS  Renal/GU Renal disease (nephrolithiasis)     Musculoskeletal  (+) Arthritis ,   Abdominal   Peds  Hematology negative hematology ROS (+)   Anesthesia Other Findings Cardiology note 08/18/21:  Plan  -Continue to use moderate to high intensive cholesterol therapy for further future risk reduction in cardiovascular disease and complication. The patient currently understands the goals, risks, and benefits of lipid treatment. We have discussed the potential side effects profile of these medications and or symptoms. They will watch for any new symptoms. -Hypertension medication management listed above has been reviewed and discussed with the patient. We will continue current medical regimen at this time for reduction of risk of cardiovascular disease. The patient will report any new or future change of blood pressure for need in potential treatment changes. We have recommended home blood pressure monitoring if able. -It has been stressed to the patient that the 4 most important factors of a healthy lifestyle with reduce cardiovascular implications includes  abstinence of tobacco use, regular physical activity including the possibility of an exercise prescription, healthy diet, and maintaining an appropriate body mass index. The patient will consider these approaches in the future. -The patient understands all risks of future cardiovascular disease process and possible signs or symptoms of progression based on discussion today. We will continue all risk factor modification and prevention measures including lipid management, exercise and diet, blood pressure control, and anti-platelet medication management as tolerated.  No orders of the defined types were placed in this encounter.  Return in about 1 year (around 08/19/2022).   Reproductive/Obstetrics                            Anesthesia Physical Anesthesia Plan  ASA: 2  Anesthesia Plan: General   Post-op Pain Management:    Induction: Intravenous  PONV Risk Score and Plan: 3 and Ondansetron, Dexamethasone and Treatment may vary due to age or medical condition  Airway Management Planned: LMA  Additional Equipment:   Intra-op Plan:   Post-operative Plan: Extubation in OR  Informed Consent: I have reviewed the patients History and Physical, chart, labs and discussed the procedure including the risks, benefits and alternatives for the proposed anesthesia with the patient or authorized representative who has indicated his/her understanding and acceptance.     Dental advisory given  Plan Discussed with: CRNA  Anesthesia Plan Comments: (Patient consented for risks of anesthesia including but not limited to:  - adverse reactions to medications - damage to eyes, teeth, lips or other oral mucosa - nerve damage due to positioning  - sore throat or hoarseness - damage to heart, brain, nerves, lungs, other parts of body  or loss of life  Informed patient about role of CRNA in peri- and intra-operative care.  Patient voiced understanding.)        Anesthesia  Quick Evaluation

## 2021-09-28 NOTE — Op Note (Signed)
Preoperative diagnosis:  Left distal ureteral calculus  Postoperative diagnosis:  Passed ureteral calculus  Procedure: Cystoscopy Left ureteroscopy-diagnostic Left retrograde pyelogram  Surgeon: Abbie Sons, MD  Anesthesia: General  Complications: None  Intraoperative findings:  Cystoscopy-urethra normal in caliber without stricture. Prostate with mild-moderate lateral lobe enlargement and mild bladder neck elevation.  A calculus was identified in the bladder which was removed by irrigation and was noted to be a size similar to the one seen on KUB Left ureteroscopy-ureter normal appearance to the lower proximal portion.  No calculi or fragments identified. Pullout left retrograde pyelogram performed through the ureteroscope showed no filling defects or contrast extravasation  EBL: Minimal  Specimens: None  Indication: George [name] is a 71 y.o. with a history of a 5 mm left distal ureteral calculus with failure to pass spontaneously and he requested ureteroscopic removal.  After reviewing the management options for treatment, he elected to proceed with the above surgical procedure(s). We have discussed the potential benefits and risks of the procedure, side effects of the proposed treatment, the likelihood of the patient achieving the goals of the procedure, and any potential problems that might occur during the procedure or recuperation. Informed consent has been obtained.  Description of procedure:  The patient was taken to the operating room and general anesthesia was induced.  The patient was placed in the dorsal lithotomy position, prepped and draped in the usual sterile fashion, and preoperative antibiotics were administered. A preoperative time-out was performed.   A 21 French cystoscope was lubricated, passed per urethra and advanced proximally into the bladder under direct vision with findings as described above.  A 0.038 Sensor wire was then placed through the  cystoscope and into the left ureteral orifice and advanced to thr renal pelvis under fluoroscoy.  A 4.5 French semirigid ureteroscope was passed per urethra and advanced into the bladder.  The ureteroscope was advanced into the left UO alongside the guidewire and advanced to the lower proximal ureter with findings described above.  The guidewire was removed and pullout left retrograde pyelogram was then performed with findings as described above.  Complete emptying of contrast from the collecting system and ureter on real-time fluoroscopy.  All instruments were removed and he was transported to the PACU in stable condition.  Plan: Postop follow-up will be scheduled in approximately 1 month   Abbie Sons, M.D.

## 2021-09-28 NOTE — Discharge Instructions (Addendum)
AMBULATORY SURGERY  DISCHARGE INSTRUCTIONS   The drugs that you were given will stay in your system until tomorrow so for the next 24 hours you should not:  Drive an automobile Make any legal decisions Drink any alcoholic beverage   You may resume regular meals tomorrow.  Today it is better to start with liquids and gradually work up to solid foods.  You may eat anything you prefer, but it is better to start with liquids, then soup and crackers, and gradually work up to solid foods.   Please notify your doctor immediately if you have any unusual bleeding, trouble breathing, redness and pain at the surgery site, drainage, fever, or pain not relieved by medication.    Additional Instructions:   DISCHARGE INSTRUCTIONS FOR KIDNEY STONE  MEDICATIONS:  1. Resume all your other meds from home.  2.  AZO (over-the-counter) can help with the burning/stinging when you urinate. 3.  Rx trospium was sent to pharmacy if needed for bladder irritation  ACTIVITY:  1. May resume regular activities in 24 hours. 2. No driving while on narcotic pain medications  3. Drink plenty of water  4. Continue to walk at home - you can still get blood clots when you are at home, so keep active, but don't over do it.  5. May return to work/school tomorrow or when you feel ready    SIGNS/SYMPTOMS TO CALL:  Common postoperative symptoms include urinary frequency, urgency, bladder spasm and blood in the urine  Please call us if you have a fever greater than 101.5, uncontrolled nausea/vomiting, uncontrolled pain, dizziness, unable to urinate, excessively bloody urine, chest pain, shortness of breath, leg swelling, leg pain, or any other concerns or questions.   You can reach [place] at [phone].   FOLLOW-UP:  1. You will be contacted for a follow-up appointment    Please contact your physician with any problems or Same Day Surgery at [phone], Monday through Friday 6 am to 4 pm, or [place] at  [facility] number at [phone].

## 2021-09-28 NOTE — Transfer of Care (Signed)
Immediate Anesthesia Transfer of Care Note  Patient: George [name]  Procedure(s) Performed: CYSTOSCOPY/DIAGNOSTIC URETEROSCOPY, LEFT RETROGRADE PYELOGRAM (Left)  Patient Location: PACU  Anesthesia Type:General  Level of Consciousness: awake, drowsy and patient cooperative  Airway & Oxygen Therapy: Patient Spontanous Breathing and Patient connected to face mask oxygen  Post-op Assessment: Report given to RN and Post -op Vital signs reviewed and stable  Post vital signs: Reviewed and stable  Last Vitals:  Vitals Value Taken Time  BP 138/85 09/28/21 1201  Temp    Pulse 55 09/28/21 1203  Resp 13 09/28/21 1203  SpO2 99 % 09/28/21 1203  Vitals shown include unvalidated device data.  Last Pain:  Vitals:   09/28/21 1041  TempSrc: Temporal  PainSc: 0-No pain         Complications: No notable events documented.

## 2021-09-28 NOTE — Interval H&P Note (Signed)
History and Physical Interval Note:  09/28/2021 11:17 AM  George [name]  has presented today for surgery, with the diagnosis of Left Ureteral Stone.  The various methods of treatment have been discussed with the patient and family. After consideration of risks, benefits and other options for treatment, the patient has consented to  Procedure(s): CYSTOSCOPY/URETEROSCOPY/HOLMIUM LASER/STENT PLACEMENT (Left) as a surgical intervention.  The patient's history has been reviewed, patient examined, no change in status, stable for surgery.  I have reviewed the patient's chart and labs.  Questions were answered to the patient's satisfaction.     [place]

## 2021-09-28 NOTE — Anesthesia Postprocedure Evaluation (Signed)
Anesthesia Post Note  Patient: George [name]  Procedure(s) Performed: CYSTOSCOPY/DIAGNOSTIC URETEROSCOPY, LEFT RETROGRADE PYELOGRAM (Left)  Patient location during evaluation: PACU Anesthesia Type: General Level of consciousness: awake and alert, oriented and patient cooperative Pain management: pain level controlled Vital Signs Assessment: post-procedure vital signs reviewed and stable Respiratory status: spontaneous breathing, nonlabored ventilation and respiratory function stable Cardiovascular status: blood pressure returned to baseline and stable Postop Assessment: adequate PO intake Anesthetic complications: no   No notable events documented.   Last Vitals:  Vitals:   09/28/21 1230 09/28/21 1244  BP: 138/79 129/84  Pulse: (!) 58 (!) 51  Resp: 15 13  Temp:  (!) 36.2 C  SpO2: 98% 97%    Last Pain:  Vitals:   09/28/21 1244  TempSrc:   PainSc: 0-No pain                 Darrin Nipper

## 2021-09-29 ENCOUNTER — Encounter: Payer: Self-pay | Admitting: Urology

## 2021-11-03 ENCOUNTER — Encounter: Payer: Self-pay | Admitting: Urology

## 2021-11-03 ENCOUNTER — Ambulatory Visit: Payer: Medicare HMO | Admitting: Urology

## 2021-11-03 VITALS — BP 136/81 | HR 77 | Ht 70.5 in | Wt 191.0 lb

## 2021-11-03 DIAGNOSIS — Z87442 Personal history of urinary calculi: Secondary | ICD-10-CM | POA: Diagnosis not present

## 2021-11-03 DIAGNOSIS — N200 Calculus of kidney: Secondary | ICD-10-CM

## 2021-11-03 NOTE — Progress Notes (Signed)
11/03/2021 12:59 PM   George [name] [birth year] [id number]  Referring provider: Adin Hector, MD [facility] [place],  [place] [address]  Chief Complaint  Patient presents with   Nephrolithiasis    Urologic history: 1.  Recurrent stone disease             -Bilateral nonobstructing nephrolithiasis  HPI: 71 y.o. male presents for postop follow-up  Seen August 2023 with intermittent pain secondary to a left distal ureteral calculus He elected ureteroscopic removal and at the time of the procedure the calculus was present in the bladder.  No stent was placed postoperatively and he had no postoperative problems  PMH: Past Medical History:  Diagnosis Date   Aortic atherosclerosis ([place])    Arthritis    Cancer ([place])    skin cancer   Coronary artery disease    GERD (gastroesophageal reflux disease)    H/O heart artery stent    History of kidney stones    Hyperlipemia    Hypertension    PONV (postoperative nausea and vomiting)     Surgical History: Past Surgical History:  Procedure Laterality Date   COLONOSCOPY N/A 02/01/2021   Procedure: COLONOSCOPY WITH BIOPSY;  Surgeon: Lucilla Lame, MD;  Location: [place];  Service: Endoscopy;  Laterality: N/A;   COLONOSCOPY WITH PROPOFOL N/A 01/29/2016   Procedure: COLONOSCOPY WITH PROPOFOL;  Surgeon: Manya Silvas, MD;  Location: [facility];  Service: Endoscopy;  Laterality: N/A;   CORONARY ANGIOPLASTY     CYSTOSCOPY/URETEROSCOPY/HOLMIUM LASER/STENT PLACEMENT Left 09/28/2021   Procedure: CYSTOSCOPY/DIAGNOSTIC URETEROSCOPY, LEFT RETROGRADE PYELOGRAM;  Surgeon: Abbie Sons, MD;  Location: [facility];  Service: Urology;  Laterality: Left;   HERNIA REPAIR     KIDNEY STONE SURGERY     x2   POLYPECTOMY N/A 02/01/2021   Procedure: POLYPECTOMY;  Surgeon: Lucilla Lame, MD;  Location: [facility];  Service: Endoscopy;  Laterality: N/A;    Home Medications:  Allergies as of  11/03/2021       Reactions   Contrast Media [iodinated Contrast Media] Hives   Penicillins    Unsure last had as a child        Medication List        Accurate as of November 03, 2021 12:59 PM. If you have any questions, ask your nurse or doctor.          aspirin EC 81 MG tablet Take 81 mg by mouth daily.   cetirizine 10 MG tablet Commonly known as: ZYRTEC Take 10 mg by mouth daily as needed for allergies.   ezetimibe 10 MG tablet Commonly known as: ZETIA Take 10 mg by mouth at bedtime.   latanoprost 0.005 % ophthalmic solution Commonly known as: XALATAN Place 1 drop into the right eye at bedtime.   lisinopril 20 MG tablet Commonly known as: ZESTRIL Take 20 mg by mouth every morning.   MAGNESIUM OXIDE PO Take 1 tablet by mouth daily at 6 (six) AM.   omeprazole 40 MG capsule Commonly known as: PRILOSEC 40 mg as needed.   Percocet 5-325 MG tablet Generic drug: oxyCODONE-acetaminophen Take 1 tablet by mouth every 4 (four) hours as needed for severe pain.   rosuvastatin 40 MG tablet Commonly known as: CRESTOR Take 40 mg by mouth at bedtime.   tadalafil 20 MG tablet Commonly known as: CIALIS Take 20 mg by mouth daily as needed.   tamsulosin 0.4 MG Caps capsule Commonly known as: FLOMAX Take 1  capsule (0.4 mg total) by mouth daily. What changed: when to take this   trospium 20 MG tablet Commonly known as: SANCTURA Take 1 tablet (20 mg total) by mouth 2 (two) times daily as needed (Urinary frequency, urgency).   VITAMIN B 12 PO Take 1 tablet by mouth daily.   VITAMIN D PO Take 1 tablet by mouth daily at 6 (six) AM.        Allergies:  Allergies  Allergen Reactions   Contrast Media [Iodinated Contrast Media] Hives   Penicillins     Unsure last had as a child    Family History: History reviewed. No pertinent family history.  Social History:  reports that he has never smoked. He has never used smokeless tobacco. He reports current alcohol  use. He reports that he does not use drugs.   Physical Exam: BP 136/81   Pulse 77   Ht 5' 10.5" (1.791 m)   Wt 191 lb (86.6 kg)   BMI 27.02 kg/m   Constitutional:  Alert and oriented, No acute distress. HEENT: [place] AT Respiratory: Normal respiratory effort, no increased work of breathing. Psychiatric: Normal mood and affect.   Assessment & Plan:    1.  Bilateral nephrolithiasis Recent passed calculus Was scheduled for annual follow-up February 2024.  This appointment was rescheduled to February 2025 with a [place], MD  [facility] [address] [place], [place] [address] [phone]

## 2022-02-17 ENCOUNTER — Ambulatory Visit: Payer: Medicare HMO | Admitting: Urology

## 2022-10-05 ENCOUNTER — Other Ambulatory Visit: Payer: Self-pay | Admitting: Internal Medicine

## 2022-10-05 DIAGNOSIS — R17 Unspecified jaundice: Secondary | ICD-10-CM

## 2022-10-10 ENCOUNTER — Ambulatory Visit
Admission: RE | Admit: 2022-10-10 | Discharge: 2022-10-10 | Disposition: A | Discharge status: Home / Self Care | Payer: Medicare HMO | Source: Ambulatory Visit | Attending: Internal Medicine | Admitting: Internal Medicine

## 2022-10-10 DIAGNOSIS — R17 Unspecified jaundice: Secondary | ICD-10-CM | POA: Insufficient documentation

## 2022-10-18 ENCOUNTER — Ambulatory Visit: Payer: Medicare HMO

## 2022-10-18 DIAGNOSIS — Z23 Encounter for immunization: Secondary | ICD-10-CM | POA: Diagnosis not present

## 2022-10-18 DIAGNOSIS — Z719 Counseling, unspecified: Secondary | ICD-10-CM

## 2022-10-18 NOTE — Progress Notes (Signed)
Patient seen in nurse clinic for COVID vaccine. Comirnaty +12Y 2024-25 IM left deltoid. Tolerated well.  [facility] updated and copy provided.

## 2023-02-15 ENCOUNTER — Ambulatory Visit: Payer: Medicare HMO | Admitting: Urology

## 2023-03-06 NOTE — Progress Notes (Unsigned)
 Referring Physician:  Linus Salmons, MD [address] [place],  [place] [address]  Primary Physician:  Lynnea Ferrier, MD  History of Present Illness: 03/07/2023 George [name] is here today with a chief complaint of low back pain that radiates up the left side of his back.  He began having pain shortly after an event where he was lifting a heavy item.  He has had pain that worsens when he sits in particular.  He has some discomfort in his lower back near his hip bone, and then a different pain that extends up the left side of his thoracic spine.  Sometimes he has a burning discomfort that extends around his flank near his bottom rib.  He feels that his symptoms are slowly improving. Conservative measures:  Physical therapy: has not participated in PT Multimodal medical therapy including regular antiinflammatories: Mobic, Tylenol, Ibuprofen, Diclofenac, Prednisone Injections: no epidural steroid injections  Past Surgery: none  Samba E Kintz has no symptoms of cervical myelopathy.  The symptoms are causing a significant impact on the patient's life.   I have utilized the care everywhere function in epic to review the outside records available from external health systems.  Review of Systems:  A 10 point review of systems is negative, except for the pertinent positives and negatives detailed in the HPI.  Past Medical History: Past Medical History:  Diagnosis Date   Aortic atherosclerosis ([facility])    Arthritis    Cancer ([facility])    skin cancer   Coronary artery disease    GERD (gastroesophageal reflux disease)    H/O heart artery stent    History of kidney stones    Hyperlipemia    Hypertension    PONV (postoperative nausea and vomiting)     Past Surgical History: Past Surgical History:  Procedure Laterality Date   COLONOSCOPY N/A 02/01/2021   Procedure: COLONOSCOPY WITH BIOPSY;  Surgeon: Midge Minium, MD;  Location[facility];   Service: Endoscopy;  Laterality: N/A;   COLONOSCOPY WITH PROPOFOL N/A 01/29/2016   Procedure: COLONOSCOPY WITH PROPOFOL;  Surgeon: Scot Jun, MD;  Location: [facility];  Service: Endoscopy;  Laterality: N/A;   CORONARY ANGIOPLASTY     CYSTOSCOPY/URETEROSCOPY/HOLMIUM LASER/STENT PLACEMENT Left 09/28/2021   Procedure: CYSTOSCOPY/DIAGNOSTIC URETEROSCOPY, LEFT RETROGRADE PYELOGRAM;  Surgeon: Riki Altes, MD;  Location: [facility];  Service: Urology;  Laterality: Left;   HERNIA REPAIR     KIDNEY STONE SURGERY     x2   POLYPECTOMY N/A 02/01/2021   Procedure: POLYPECTOMY;  Surgeon: Midge Minium, MD;  Location: [facility];  Service: Endoscopy;  Laterality: N/A;    Allergies: Allergies as of 03/07/2023 - Review Complete 03/07/2023  Allergen Reaction Noted   Contrast media [iodinated contrast media] Hives 01/10/2016   Penicillins  01/10/2016   Scopolamine  03/07/2023    Medications:  Current Outpatient Medications:    aspirin EC 81 MG tablet, Take 81 mg by mouth daily., Disp: , Rfl:    cetirizine (ZYRTEC) 10 MG tablet, Take 10 mg by mouth daily as needed for allergies., Disp: , Rfl:    Cyanocobalamin (VITAMIN B 12 PO), Take 1 tablet by mouth daily., Disp: , Rfl:    ezetimibe (ZETIA) 10 MG tablet, Take 10 mg by mouth at bedtime., Disp: , Rfl:    latanoprost (XALATAN) 0.005 % ophthalmic solution, Place 1 drop into the right eye at bedtime., Disp: , Rfl:    lisinopril (ZESTRIL) 20 MG tablet, Take  20 mg by mouth every morning., Disp: , Rfl:    MAGNESIUM OXIDE PO, Take 1 tablet by mouth daily at 6 (six) AM., Disp: , Rfl:    omeprazole (PRILOSEC) 40 MG capsule, 40 mg as needed., Disp: , Rfl:    oxyCODONE-acetaminophen (PERCOCET) 5-325 MG tablet, Take 1 tablet by mouth every 4 (four) hours as needed for severe pain., Disp: , Rfl:    rosuvastatin (CRESTOR) 40 MG tablet, Take 40 mg by mouth at bedtime., Disp: , Rfl:    tadalafil (ADCIRCA/CIALIS) 20 MG tablet, Take 20 mg by mouth  daily as needed., Disp: , Rfl:    tamsulosin (FLOMAX) 0.4 MG CAPS capsule, Take 1 capsule (0.4 mg total) by mouth daily. (Patient taking differently: Take 0.4 mg by mouth daily after supper.), Disp: 30 capsule, Rfl: 0   trospium (SANCTURA) 20 MG tablet, Take 1 tablet (20 mg total) by mouth 2 (two) times daily as needed (Urinary frequency, urgency)., Disp: 10 tablet, Rfl: 0   VITAMIN D PO, Take 1 tablet by mouth daily at 6 (six) AM., Disp: , Rfl:   Social History: Social History   Tobacco Use   Smoking status: Never   Smokeless tobacco: Never  Vaping Use   Vaping status: Never Used  Substance Use Topics   Alcohol use: Yes    Comment: rare   Drug use: No    Family Medical History: History reviewed. No pertinent family history.  Physical Examination: Vitals:   03/07/23 1319  BP: 112/70    General: Patient is in no apparent distress. Attention to examination is appropriate.  Neck:   Supple.  Full range of motion.  Respiratory: Patient is breathing without any difficulty.   NEUROLOGICAL:     Awake, alert, oriented to person, place, and time.  Speech is clear and fluent.   Cranial Nerves: Pupils equal round and reactive to light.  Facial tone is symmetric.  Facial sensation is symmetric. Shoulder shrug is symmetric. Tongue protrusion is midline.  There is no pronator drift.  Strength: Side Biceps Triceps Deltoid Interossei Grip Wrist Ext. Wrist Flex.  R 5 5 5 5 5 5 5   L 5 5 5 5 5 5 5    Side Iliopsoas Quads Hamstring PF DF EHL  R 5 5 5 5 5 5   L 5 5 5 5 5 5    Reflexes are 1+ and symmetric at the biceps, triceps, brachioradialis, patella and achilles.   Hoffman's is absent.   Bilateral upper and lower extremity sensation is intact to light touch.    No evidence of dysmetria noted.  Gait is normal.    He has mild tenderness to palpation left greater than right sacroiliac region   Medical Decision Making  Imaging: I reviewed his old imaging.  No dedicated spine  imaging to report.  I have personally reviewed the images and agree with the above interpretation.  Assessment and Plan: George [name] is a pleasant 73 y.o. male with left greater than right sacroiliitis as well as left-sided thoracic back pain that could involve a left lower thoracic radiculopathy.  His symptoms are overall slightly improving.  They are not currently activity limiting.  Most of the time, he has little pain.  He has been using naproxen to help with his pain.  I recommended that he continue naproxen 2 tablets over-the-counter twice a day as needed for now.  We discussed other options including exercises, physical therapy, and chiropractic care.  Additionally, we discussed the possibility of steroid injections.  Given his slow improvement, he would like to watch this for now.  I will see him back in 6 weeks to review.  I spent a total of 30 minutes in this patient's care today. This time was spent reviewing pertinent records including imaging studies, obtaining and confirming history, performing a directed evaluation, formulating and discussing my recommendations, and documenting the visit within the medical record.      Thank you for involving me in the care of this patient.      Shenae Bonanno K. Myer Haff MD, [facility] Neurosurgery

## 2023-03-07 ENCOUNTER — Ambulatory Visit: Payer: Medicare HMO | Admitting: Neurosurgery

## 2023-03-07 ENCOUNTER — Encounter: Payer: Self-pay | Admitting: Neurosurgery

## 2023-03-07 VITALS — BP 112/70 | Ht 70.0 in | Wt 186.2 lb

## 2023-03-07 DIAGNOSIS — G8929 Other chronic pain: Secondary | ICD-10-CM | POA: Diagnosis not present

## 2023-03-07 DIAGNOSIS — M5414 Radiculopathy, thoracic region: Secondary | ICD-10-CM | POA: Diagnosis not present

## 2023-03-07 DIAGNOSIS — M533 Sacrococcygeal disorders, not elsewhere classified: Secondary | ICD-10-CM | POA: Diagnosis not present

## 2023-03-07 DIAGNOSIS — M546 Pain in thoracic spine: Secondary | ICD-10-CM

## 2023-03-08 ENCOUNTER — Other Ambulatory Visit: Payer: Self-pay | Admitting: *Deleted

## 2023-03-08 DIAGNOSIS — N200 Calculus of kidney: Secondary | ICD-10-CM

## 2023-03-09 ENCOUNTER — Encounter: Payer: Self-pay | Admitting: Urology

## 2023-03-09 ENCOUNTER — Ambulatory Visit
Admission: RE | Admit: 2023-03-09 | Discharge: 2023-03-09 | Disposition: A | Discharge status: Home / Self Care | Payer: Medicare HMO | Attending: Urology | Admitting: Urology

## 2023-03-09 ENCOUNTER — Ambulatory Visit
Admission: RE | Admit: 2023-03-09 | Discharge: 2023-03-09 | Disposition: A | Discharge status: Home / Self Care | Payer: Medicare HMO | Source: Ambulatory Visit | Attending: Urology | Admitting: Urology

## 2023-03-09 ENCOUNTER — Ambulatory Visit: Payer: Medicare HMO | Admitting: Urology

## 2023-03-09 VITALS — BP 131/79 | HR 68 | Ht 70.0 in | Wt 182.0 lb

## 2023-03-09 DIAGNOSIS — N200 Calculus of kidney: Secondary | ICD-10-CM

## 2023-03-09 DIAGNOSIS — Z125 Encounter for screening for malignant neoplasm of prostate: Secondary | ICD-10-CM

## 2023-03-09 NOTE — Progress Notes (Signed)
 I, Maysun Anabel Bene, acting as a scribe for Riki Altes, MD., have documented all relevant documentation on the behalf of Riki Altes, MD, as directed by Riki Altes, MD while in the presence of Riki Altes, MD.  03/09/2023 11:42 AM   George [name] 07-Feb-1950 [id number]  Referring provider: Lynnea Ferrier, MD [address] [facility] [place],  [place] [address]  Chief Complaint  Patient presents with   Nephrolithiasis   Urologic history: 1.  Recurrent stone disease Bilateral nonobstructing nephrolithiasis  HPI: George [name] is a 73 y.o. male presents for a follow-up visit.  No complaints since his last visit in August 2023.  Passed a small stone last week without pain or discomfort. No bothersome lower urinary tract symptoms.  Denies dysuria, hematuria   PMH: Past Medical History:  Diagnosis Date   Aortic atherosclerosis (HCC)    Arthritis    Cancer (HCC)    skin cancer   Coronary artery disease    GERD (gastroesophageal reflux disease)    H/O heart artery stent    History of kidney stones    Hyperlipemia    Hypertension    PONV (postoperative nausea and vomiting)     Surgical History: Past Surgical History:  Procedure Laterality Date   COLONOSCOPY N/A 02/01/2021   Procedure: COLONOSCOPY WITH BIOPSY;  Surgeon: Midge Minium, MD;  Location: [facility];  Service: Endoscopy;  Laterality: N/A;   COLONOSCOPY WITH PROPOFOL N/A 01/29/2016   Procedure: COLONOSCOPY WITH PROPOFOL;  Surgeon: Scot Jun, MD;  Location: [facility] END[facility];  Service: Endoscopy;  Laterality: N/A;   CORONARY ANGIOPLASTY     CYSTOSCOPY/URETEROSCOPY/HOLMIUM LASER/STENT PLACEMENT Left 09/28/2021   Procedure: CYSTOSCOPY/DIAGNOSTIC URETEROSCOPY, LEFT RETROGRADE PYELOGRAM;  Surgeon: Riki Altes, MD;  Location: [facility] ORS;  Service: Urology;  Laterality: Left;   HERNIA REPAIR     KIDNEY STONE SURGERY     x2   POLYPECTOMY N/A 02/01/2021   Procedure:  POLYPECTOMY;  Surgeon: Midge Minium, MD;  Location: [facility];  Service: Endoscopy;  Laterality: N/A;    Home Medications:  Allergies as of 03/09/2023       Reactions   Contrast Media [iodinated Contrast Media] Hives   Penicillins    Unsure last had as a child   Scopolamine    Patient states he is sensitive to this medication        Medication List        Accurate as of March 09, 2023 11:42 AM. If you have any questions, ask your nurse or doctor.          aspirin EC 81 MG tablet Take 81 mg by mouth daily.   cetirizine 10 MG tablet Commonly known as: ZYRTEC Take 10 mg by mouth daily as needed for allergies.   ezetimibe 10 MG tablet Commonly known as: ZETIA Take 10 mg by mouth at bedtime.   latanoprost 0.005 % ophthalmic solution Commonly known as: XALATAN Place 1 drop into the right eye at bedtime.   lisinopril 20 MG tablet Commonly known as: ZESTRIL Take 20 mg by mouth every morning.   MAGNESIUM OXIDE PO Take 1 tablet by mouth daily at 6 (six) AM.   omeprazole 40 MG capsule Commonly known as: PRILOSEC 40 mg as needed.   Percocet 5-325 MG tablet Generic drug: oxyCODONE-acetaminophen Take 1 tablet by mouth every 4 (four) hours as needed for severe pain.   rosuvastatin 40 MG tablet Commonly known as:  CRESTOR Take 40 mg by mouth at bedtime.   tadalafil 20 MG tablet Commonly known as: CIALIS Take 20 mg by mouth daily as needed.   tamsulosin 0.4 MG Caps capsule Commonly known as: FLOMAX Take 1 capsule (0.4 mg total) by mouth daily. What changed: when to take this   trospium 20 MG tablet Commonly known as: SANCTURA Take 1 tablet (20 mg total) by mouth 2 (two) times daily as needed (Urinary frequency, urgency).   VITAMIN B 12 PO Take 1 tablet by mouth daily.   VITAMIN D PO Take 1 tablet by mouth daily at 6 (six) AM.        Allergies:  Allergies  Allergen Reactions   Contrast Media [Iodinated Contrast Media] Hives   Penicillins      Unsure last had as a child   Scopolamine     Patient states he is sensitive to this medication    Social History:  reports that he has never smoked. He has never used smokeless tobacco. He reports current alcohol use. He reports that he does not use drugs.   Physical Exam: BP 131/79   Pulse 68   Ht 5\' 10"  (1.778 m)   Wt 182 lb (82.6 kg)   BMI 26.11 kg/m   Constitutional:  Alert and oriented, No acute distress. HEENT: [place] AT Respiratory: Normal respiratory effort, no increased work of breathing. GU: Prostate 50 grams, smooth without nodules.  Psychiatric: Normal mood and affect.   Pertinent Imaging: KUB performed earlier today was personally reviewed and interpreted. There are bilateral calcifications overlying the renal outlines which have not significantly changed from a previous KUB of 08/24/2020.   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 Prostate cancer screening PSA February 2024 was 1.72 Annual follow-up with PCP next month, PSA to be drawn at that time   I have reviewed the above documentation for accuracy and completeness, and I agree with the above.   Riki Altes, MD  [facility] [address], [address] [place], [place] [address] [phone]

## 2023-03-20 ENCOUNTER — Encounter: Payer: Self-pay | Admitting: Urology

## 2023-04-05 ENCOUNTER — Other Ambulatory Visit: Payer: Self-pay | Admitting: Internal Medicine

## 2023-04-05 DIAGNOSIS — R253 Fasciculation: Secondary | ICD-10-CM

## 2023-04-10 ENCOUNTER — Ambulatory Visit
Admission: RE | Admit: 2023-04-10 | Discharge: 2023-04-10 | Disposition: A | Discharge status: Home / Self Care | Source: Ambulatory Visit | Attending: Internal Medicine | Admitting: Internal Medicine

## 2023-04-10 DIAGNOSIS — R253 Fasciculation: Secondary | ICD-10-CM | POA: Insufficient documentation

## 2023-04-25 ENCOUNTER — Ambulatory Visit: Payer: Medicare HMO | Admitting: Neurosurgery

## 2023-04-25 DIAGNOSIS — G8929 Other chronic pain: Secondary | ICD-10-CM | POA: Diagnosis not present

## 2023-04-25 DIAGNOSIS — M5414 Radiculopathy, thoracic region: Secondary | ICD-10-CM

## 2023-04-25 DIAGNOSIS — M461 Sacroiliitis, not elsewhere classified: Secondary | ICD-10-CM | POA: Diagnosis not present

## 2023-04-25 DIAGNOSIS — M546 Pain in thoracic spine: Secondary | ICD-10-CM

## 2023-04-25 DIAGNOSIS — M533 Sacrococcygeal disorders, not elsewhere classified: Secondary | ICD-10-CM

## 2023-04-25 NOTE — Progress Notes (Signed)
 Referring Physician:  Lynnea Ferrier, MD [address] [facility] [place],  [place] [address]  Primary Physician:  Lynnea Ferrier, MD  History of Present Illness: 04/25/2023 Mr. Minnie returns to see me.  He is feeling relatively well.  He has been very active.  He did note some twitching in his calves.  03/07/2023 Mr. Konnar Ben is here today with a chief complaint of low back pain that radiates up the left side of his back.  He began having pain shortly after an event where he was lifting a heavy item.  He has had pain that worsens when he sits in particular.  He has some discomfort in his lower back near his hip bone, and then a different pain that extends up the left side of his thoracic spine.  Sometimes he has a burning discomfort that extends around his flank near his bottom rib.  He feels that his symptoms are slowly improving. Conservative measures:  Physical therapy: has not participated in PT Multimodal medical therapy including regular antiinflammatories: Mobic, Tylenol, Ibuprofen, Diclofenac, Prednisone Injections: no epidural steroid injections  Past Surgery: none  Buzz E Speranza has no symptoms of cervical myelopathy.  The symptoms are causing a significant impact on the patient's life.   I have utilized the care everywhere function in epic to review the outside records available from external health systems.  Review of Systems:  A 10 point review of systems is negative, except for the pertinent positives and negatives detailed in the HPI.  Past Medical History: Past Medical History:  Diagnosis Date   Aortic atherosclerosis (HCC)    Arthritis    Cancer (HCC)    skin cancer   Coronary artery disease    GERD (gastroesophageal reflux disease)    H/O heart artery stent    History of kidney stones    Hyperlipemia    Hypertension    PONV (postoperative nausea and vomiting)     Past Surgical History: Past Surgical History:  Procedure  Laterality Date   COLONOSCOPY N/A 02/01/2021   Procedure: COLONOSCOPY WITH BIOPSY;  Surgeon: Midge Minium, MD;  Location: [facility];  Service: Endoscopy;  Laterality: N/A;   COLONOSCOPY WITH PROPOFOL N/A 01/29/2016   Procedure: COLONOSCOPY WITH PROPOFOL;  Surgeon: Scot Jun, MD;  Location: [facility] ENDOSCOPY;  Service: Endoscopy;  Laterality: N/A;   CORONARY ANGIOPLASTY     CYSTOSCOPY/URETEROSCOPY/HOLMIUM LASER/STENT PLACEMENT Left 09/28/2021   Procedure: CYSTOSCOPY/DIAGNOSTIC URETEROSCOPY, LEFT RETROGRADE PYELOGRAM;  Surgeon: Riki Altes, MD;  Location: [facility] ORS;  Service: Urology;  Laterality: Left;   HERNIA REPAIR     KIDNEY STONE SURGERY     x2   POLYPECTOMY N/A 02/01/2021   Procedure: POLYPECTOMY;  Surgeon: Midge Minium, MD;  Location: [facility];  Service: Endoscopy;  Laterality: N/A;    Allergies: Allergies as of 04/25/2023 - Review Complete 03/09/2023  Allergen Reaction Noted   Contrast media [iodinated contrast media] Hives 01/10/2016   Penicillins  01/10/2016   Scopolamine  03/07/2023    Medications:  Current Outpatient Medications:    aspirin EC 81 MG tablet, Take 81 mg by mouth daily., Disp: , Rfl:    cetirizine (ZYRTEC) 10 MG tablet, Take 10 mg by mouth daily as needed for allergies., Disp: , Rfl:    Cyanocobalamin (VITAMIN B 12 PO), Take 1 tablet by mouth daily., Disp: , Rfl:    ezetimibe (ZETIA) 10 MG tablet, Take 10 mg by mouth at bedtime., Disp: ,  Rfl:    latanoprost (XALATAN) 0.005 % ophthalmic solution, Place 1 drop into the right eye at bedtime., Disp: , Rfl:    lisinopril (ZESTRIL) 20 MG tablet, Take 20 mg by mouth every morning., Disp: , Rfl:    MAGNESIUM OXIDE PO, Take 1 tablet by mouth daily at 6 (six) AM., Disp: , Rfl:    omeprazole (PRILOSEC) 40 MG capsule, 40 mg as needed., Disp: , Rfl:    oxyCODONE-acetaminophen (PERCOCET) 5-325 MG tablet, Take 1 tablet by mouth every 4 (four) hours as needed for severe pain., Disp: , Rfl:     rosuvastatin (CRESTOR) 40 MG tablet, Take 40 mg by mouth at bedtime., Disp: , Rfl:    tadalafil (ADCIRCA/CIALIS) 20 MG tablet, Take 20 mg by mouth daily as needed., Disp: , Rfl:    tamsulosin (FLOMAX) 0.4 MG CAPS capsule, Take 1 capsule (0.4 mg total) by mouth daily. (Patient taking differently: Take 0.4 mg by mouth daily after supper.), Disp: 30 capsule, Rfl: 0   trospium (SANCTURA) 20 MG tablet, Take 1 tablet (20 mg total) by mouth 2 (two) times daily as needed (Urinary frequency, urgency)., Disp: 10 tablet, Rfl: 0   VITAMIN D PO, Take 1 tablet by mouth daily at 6 (six) AM., Disp: , Rfl:   Social History: Social History   Tobacco Use   Smoking status: Never   Smokeless tobacco: Never  Vaping Use   Vaping status: Never Used  Substance Use Topics   Alcohol use: Yes    Comment: rare   Drug use: No    Family Medical History: No family history on file.  Physical Examination: There were no vitals filed for this visit.   General: Patient is in no apparent distress. Attention to examination is appropriate.  Neck:   Supple.  Full range of motion.  Respiratory: Patient is breathing without any difficulty.   NEUROLOGICAL:     Awake, alert, oriented to person, place, and time.  Speech is clear and fluent.   Cranial Nerves: Pupils equal round and reactive to light.  Facial tone is symmetric.  Facial sensation is symmetric. Shoulder shrug is symmetric. Tongue protrusion is midline.  There is no pronator drift.  Strength: Side Biceps Triceps Deltoid Interossei Grip Wrist Ext. Wrist Flex.  R 5 5 5 5 5 5 5   L 5 5 5 5 5 5 5    Side Iliopsoas Quads Hamstring PF DF EHL  R 5 5 5 5 5 5   L 5 5 5 5 5 5    Reflexes are 1+ and symmetric at the biceps, triceps, brachioradialis, patella and achilles.   Hoffman's is absent.   Bilateral upper and lower extremity sensation is intact to light touch.    No evidence of dysmetria noted.  Gait is normal.      Medical Decision  Making  Imaging: MRI L spine 04/10/2023 IMPRESSION: 1. L2-3: Bulging of the disc slightly more prominent towards the left. Facet degeneration and hypertrophy, worse on the left than the right. Mild narrowing of the lateral recesses but no visible neural compression. Mild foraminal encroachment by disc material on the left could possibly irritate the left L2 nerve, though gross compression is not demonstrated. The facet arthritis could be a cause of back pain or referred facet syndrome pain. 2. Lesser degenerative changes from L3-4 through L5-S1. Though the facet arthritis does not appear as advanced as seen at the L2-3 level, this could contribute to low back pain. No stenosis or neural compression in that  region.     Electronically Signed   By: Bettylou Brunner M.D.   On: 04/22/2023 10:21  I have personally reviewed the images and agree with the above interpretation.  Assessment and Plan: Mr. Uemura is a pleasant 72 y.o. male with left greater than right sacroiliitis as well as left-sided thoracic back pain that could involve a left lower thoracic radiculopathy.  He is currently doing well.  He did express some concern about whether he might have amyotrophic lateral sclerosis.  I think this is extremely unlikely, as he has no evidence of hyperreflexia and has no tongue fasciculations.  He also has no weakness or muscle loss.  I will be happy to reevaluate in the future, but I do not feel that he warrants any further investigation for ALS at this time.  He will let me know if he has additional symptoms and would prefer reevaluation.  His lumbar spine MRI scan is very reassuring.    I spent a total of 10 minutes in this patient's care today. This time was spent reviewing pertinent records including imaging studies, obtaining and confirming history, performing a directed evaluation, formulating and discussing my recommendations, and documenting the visit within the medical record.       Thank you for involving me in the care of this patient.      Shikita Vaillancourt K. Mont Antis MD, [facility]

## 2023-05-17 ENCOUNTER — Encounter: Payer: Self-pay | Admitting: Urology

## 2023-05-22 ENCOUNTER — Other Ambulatory Visit: Payer: Self-pay | Admitting: *Deleted

## 2023-05-22 ENCOUNTER — Other Ambulatory Visit

## 2023-05-22 ENCOUNTER — Encounter: Payer: Self-pay | Admitting: Neurosurgery

## 2023-05-22 DIAGNOSIS — N200 Calculus of kidney: Secondary | ICD-10-CM

## 2023-05-22 DIAGNOSIS — R202 Paresthesia of skin: Secondary | ICD-10-CM

## 2023-05-22 LAB — MICROSCOPIC EXAMINATION

## 2023-05-22 LAB — URINALYSIS, COMPLETE
Bilirubin, UA: NEGATIVE
Glucose, UA: NEGATIVE
Ketones, UA: NEGATIVE
Leukocytes,UA: NEGATIVE
Nitrite, UA: NEGATIVE
Protein,UA: NEGATIVE
RBC, UA: NEGATIVE
Specific Gravity, UA: 1.015 (ref 1.005–1.030)
Urobilinogen, Ur: 1 mg/dL (ref 0.2–1.0)
pH, UA: 7.5 (ref 5.0–7.5)

## 2023-05-23 NOTE — Telephone Encounter (Signed)
 Referral placed.

## 2023-05-25 LAB — CULTURE, URINE COMPREHENSIVE

## 2024-01-18 ENCOUNTER — Other Ambulatory Visit: Payer: Self-pay | Admitting: *Deleted

## 2024-01-18 DIAGNOSIS — N200 Calculus of kidney: Secondary | ICD-10-CM

## 2024-01-29 ENCOUNTER — Other Ambulatory Visit: Payer: Self-pay

## 2024-01-29 ENCOUNTER — Other Ambulatory Visit

## 2024-01-29 DIAGNOSIS — N200 Calculus of kidney: Secondary | ICD-10-CM

## 2024-02-07 ENCOUNTER — Ambulatory Visit: Payer: Self-pay | Admitting: Urology

## 2024-02-07 LAB — STONE ANALYSIS
Calcium Oxalate Monohydrate: 100 %
Weight Calculi: 66 mg

## 2024-03-08 ENCOUNTER — Ambulatory Visit: Payer: Medicare HMO | Admitting: Urology
